# Patient Record
Sex: Female | Born: 1965 | Race: Black or African American | Hispanic: No | Smoking: Former smoker
Health system: Southern US, Community
[De-identification: ages and names within clinical notes are randomized; demographics above are authoritative.]

## PROBLEM LIST (undated history)

## (undated) DIAGNOSIS — K5909 Other constipation: Secondary | ICD-10-CM

## (undated) DIAGNOSIS — F209 Schizophrenia, unspecified: Secondary | ICD-10-CM

## (undated) DIAGNOSIS — K649 Unspecified hemorrhoids: Secondary | ICD-10-CM

## (undated) DIAGNOSIS — N912 Amenorrhea, unspecified: Secondary | ICD-10-CM

## (undated) DIAGNOSIS — R630 Anorexia: Secondary | ICD-10-CM

## (undated) DIAGNOSIS — R32 Unspecified urinary incontinence: Secondary | ICD-10-CM

## (undated) DIAGNOSIS — E039 Hypothyroidism, unspecified: Secondary | ICD-10-CM

## (undated) DIAGNOSIS — E7800 Pure hypercholesterolemia, unspecified: Secondary | ICD-10-CM

## (undated) DIAGNOSIS — E785 Hyperlipidemia, unspecified: Secondary | ICD-10-CM

## (undated) DIAGNOSIS — J309 Allergic rhinitis, unspecified: Secondary | ICD-10-CM

## (undated) DIAGNOSIS — B009 Herpesviral infection, unspecified: Secondary | ICD-10-CM

## (undated) DIAGNOSIS — R45851 Suicidal ideations: Secondary | ICD-10-CM

## (undated) DIAGNOSIS — K625 Hemorrhage of anus and rectum: Secondary | ICD-10-CM

## (undated) DIAGNOSIS — K219 Gastro-esophageal reflux disease without esophagitis: Secondary | ICD-10-CM

## (undated) DIAGNOSIS — Z8659 Personal history of other mental and behavioral disorders: Secondary | ICD-10-CM

## (undated) DIAGNOSIS — I10 Essential (primary) hypertension: Secondary | ICD-10-CM

## (undated) DIAGNOSIS — G43909 Migraine, unspecified, not intractable, without status migrainosus: Secondary | ICD-10-CM

## (undated) DIAGNOSIS — E538 Deficiency of other specified B group vitamins: Secondary | ICD-10-CM

## (undated) DIAGNOSIS — F32A Depression, unspecified: Secondary | ICD-10-CM

## (undated) DIAGNOSIS — F329 Major depressive disorder, single episode, unspecified: Secondary | ICD-10-CM

## (undated) DIAGNOSIS — F1910 Other psychoactive substance abuse, uncomplicated: Secondary | ICD-10-CM

## (undated) HISTORY — DX: Depression, unspecified: F32.A

## (undated) HISTORY — DX: Unspecified urinary incontinence: R32

## (undated) HISTORY — DX: Anorexia: R63.0

## (undated) HISTORY — PX: TOTAL ABDOMINAL HYSTERECTOMY: SHX209

## (undated) HISTORY — DX: Hyperlipidemia, unspecified: E78.5

## (undated) HISTORY — DX: Other psychoactive substance abuse, uncomplicated: F19.10

## (undated) HISTORY — DX: Suicidal ideations: R45.851

## (undated) HISTORY — DX: Migraine, unspecified, not intractable, without status migrainosus: G43.909

## (undated) HISTORY — PX: TUBAL LIGATION: SHX77

## (undated) HISTORY — DX: Other constipation: K59.09

## (undated) HISTORY — PX: WRIST SURGERY: SHX841

## (undated) HISTORY — DX: Schizophrenia, unspecified: F20.9

## (undated) HISTORY — DX: Allergic rhinitis, unspecified: J30.9

## (undated) HISTORY — DX: Hypothyroidism, unspecified: E03.9

## (undated) HISTORY — DX: Hemorrhage of anus and rectum: K62.5

## (undated) HISTORY — DX: Herpesviral infection, unspecified: B00.9

## (undated) HISTORY — DX: Deficiency of other specified B group vitamins: E53.8

## (undated) HISTORY — DX: Amenorrhea, unspecified: N91.2

## (undated) HISTORY — DX: Gastro-esophageal reflux disease without esophagitis: K21.9

## (undated) HISTORY — DX: Personal history of other mental and behavioral disorders: Z86.59

## (undated) HISTORY — DX: Unspecified hemorrhoids: K64.9

---
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
## 2003-07-08 ENCOUNTER — Encounter: Admission: RE | Admit: 2003-07-08 | Discharge: 2003-07-08 | Payer: Self-pay | Admitting: Obstetrics and Gynecology

## 2003-07-15 ENCOUNTER — Encounter: Admission: RE | Admit: 2003-07-15 | Discharge: 2003-07-15 | Payer: Self-pay | Admitting: Obstetrics & Gynecology

## 2010-09-11 ENCOUNTER — Encounter: Payer: Self-pay | Admitting: Obstetrics & Gynecology

## 2014-07-04 ENCOUNTER — Encounter (HOSPITAL_COMMUNITY): Payer: Self-pay | Admitting: *Deleted

## 2014-07-04 ENCOUNTER — Emergency Department (HOSPITAL_COMMUNITY)
Admission: EM | Admit: 2014-07-04 | Discharge: 2014-07-05 | Disposition: A | Discharge status: Other Acute Inpatient Hospital | Payer: Medicaid Other | Attending: Emergency Medicine | Admitting: Emergency Medicine

## 2014-07-04 DIAGNOSIS — Z008 Encounter for other general examination: Secondary | ICD-10-CM | POA: Insufficient documentation

## 2014-07-04 DIAGNOSIS — Z720 Tobacco use: Secondary | ICD-10-CM | POA: Diagnosis not present

## 2014-07-04 DIAGNOSIS — F1010 Alcohol abuse, uncomplicated: Secondary | ICD-10-CM | POA: Diagnosis not present

## 2014-07-04 DIAGNOSIS — F1410 Cocaine abuse, uncomplicated: Secondary | ICD-10-CM | POA: Diagnosis not present

## 2014-07-04 DIAGNOSIS — I10 Essential (primary) hypertension: Secondary | ICD-10-CM | POA: Diagnosis not present

## 2014-07-04 DIAGNOSIS — Z8639 Personal history of other endocrine, nutritional and metabolic disease: Secondary | ICD-10-CM | POA: Insufficient documentation

## 2014-07-04 HISTORY — DX: Essential (primary) hypertension: I10

## 2014-07-04 HISTORY — DX: Pure hypercholesterolemia, unspecified: E78.00

## 2014-07-04 LAB — CBC WITH DIFFERENTIAL/PLATELET
Basophils Absolute: 0 10*3/uL (ref 0.0–0.1)
Basophils Relative: 1 % (ref 0–1)
Eosinophils Absolute: 0.4 10*3/uL (ref 0.0–0.7)
Eosinophils Relative: 5 % (ref 0–5)
HCT: 36.8 % (ref 36.0–46.0)
Hemoglobin: 12.3 g/dL (ref 12.0–15.0)
Lymphocytes Relative: 29 % (ref 12–46)
Lymphs Abs: 2.6 10*3/uL (ref 0.7–4.0)
MCH: 29.4 pg (ref 26.0–34.0)
MCHC: 33.4 g/dL (ref 30.0–36.0)
MCV: 88 fL (ref 78.0–100.0)
Monocytes Absolute: 0.8 10*3/uL (ref 0.1–1.0)
Monocytes Relative: 9 % (ref 3–12)
Neutro Abs: 4.9 10*3/uL (ref 1.7–7.7)
Neutrophils Relative %: 56 % (ref 43–77)
Platelets: 333 10*3/uL (ref 150–400)
RBC: 4.18 MIL/uL (ref 3.87–5.11)
RDW: 13.9 % (ref 11.5–15.5)
WBC: 8.7 10*3/uL (ref 4.0–10.5)

## 2014-07-04 LAB — I-STAT TROPONIN, ED: Troponin i, poc: 0 ng/mL (ref 0.00–0.08)

## 2014-07-04 LAB — BASIC METABOLIC PANEL
Anion gap: 15 (ref 5–15)
BUN: 13 mg/dL (ref 6–23)
CO2: 24 mEq/L (ref 19–32)
Calcium: 9.6 mg/dL (ref 8.4–10.5)
Chloride: 102 mEq/L (ref 96–112)
Creatinine, Ser: 0.98 mg/dL (ref 0.50–1.10)
GFR calc Af Amer: 77 mL/min — ABNORMAL LOW (ref >90)
GFR calc non Af Amer: 66 mL/min — ABNORMAL LOW (ref >90)
Glucose, Bld: 87 mg/dL (ref 70–99)
Potassium: 4.5 mEq/L (ref 3.7–5.3)
Sodium: 141 mEq/L (ref 137–147)

## 2014-07-04 LAB — RAPID URINE DRUG SCREEN, HOSP PERFORMED
Amphetamines: NOT DETECTED
Barbiturates: NOT DETECTED
Benzodiazepines: NOT DETECTED
Cocaine: POSITIVE — AB
Opiates: NOT DETECTED
Tetrahydrocannabinol: NOT DETECTED

## 2014-07-04 LAB — HEPATIC FUNCTION PANEL
ALT: 14 U/L (ref 0–35)
AST: 30 U/L (ref 0–37)
Albumin: 3.8 g/dL (ref 3.5–5.2)
Alkaline Phosphatase: 37 U/L — ABNORMAL LOW (ref 39–117)
Bilirubin, Direct: <0.2 mg/dL (ref 0.0–0.3)
Total Bilirubin: <0.2 mg/dL — ABNORMAL LOW (ref 0.3–1.2)
Total Protein: 7.7 g/dL (ref 6.0–8.3)

## 2014-07-04 LAB — POC URINE PREG, ED: Preg Test, Ur: NEGATIVE

## 2014-07-04 LAB — ETHANOL: Alcohol, Ethyl (B): <11 mg/dL (ref 0–11)

## 2014-07-04 MED ORDER — ASPIRIN EC 81 MG PO TBEC
81.0000 mg | DELAYED_RELEASE_TABLET | Freq: Every day | ORAL | Status: DC
  Administered 2014-07-05: 81 mg via ORAL
  Filled 2014-07-04: qty 1

## 2014-07-04 MED ORDER — DIVALPROEX SODIUM 250 MG PO DR TAB
500.0000 mg | DELAYED_RELEASE_TABLET | Freq: Two times a day (BID) | ORAL | Status: DC
  Administered 2014-07-04 – 2014-07-05 (×2): 500 mg via ORAL
  Filled 2014-07-04 (×2): qty 2

## 2014-07-04 MED ORDER — ZOLPIDEM TARTRATE 5 MG PO TABS
10.0000 mg | ORAL_TABLET | Freq: Every day | ORAL | Status: DC
  Administered 2014-07-04: 10 mg via ORAL
  Filled 2014-07-04 (×2): qty 2

## 2014-07-04 MED ORDER — ROSUVASTATIN CALCIUM 5 MG PO TABS
5.0000 mg | ORAL_TABLET | Freq: Every day | ORAL | Status: DC
  Administered 2014-07-05: 5 mg via ORAL
  Filled 2014-07-04: qty 1

## 2014-07-04 MED ORDER — VALACYCLOVIR HCL 500 MG PO TABS
500.0000 mg | ORAL_TABLET | Freq: Every day | ORAL | Status: DC
  Administered 2014-07-05: 500 mg via ORAL
  Filled 2014-07-04: qty 1

## 2014-07-04 MED ORDER — NICOTINE 21 MG/24HR TD PT24
21.0000 mg | MEDICATED_PATCH | Freq: Once | TRANSDERMAL | Status: DC
  Administered 2014-07-04: 21 mg via TRANSDERMAL
  Filled 2014-07-04: qty 1

## 2014-07-04 MED ORDER — FENOFIBRATE 160 MG PO TABS
160.0000 mg | ORAL_TABLET | Freq: Every day | ORAL | Status: DC
  Administered 2014-07-05: 160 mg via ORAL
  Filled 2014-07-04: qty 1

## 2014-07-04 MED ORDER — LORATADINE 10 MG PO TABS
10.0000 mg | ORAL_TABLET | Freq: Every day | ORAL | Status: DC
  Administered 2014-07-05: 10 mg via ORAL
  Filled 2014-07-04: qty 1

## 2014-07-04 MED ORDER — FLUOXETINE HCL 20 MG PO CAPS
20.0000 mg | ORAL_CAPSULE | Freq: Every day | ORAL | Status: DC
  Administered 2014-07-05: 20 mg via ORAL
  Filled 2014-07-04: qty 1

## 2014-07-04 MED ORDER — DOCUSATE SODIUM 100 MG PO CAPS
100.0000 mg | ORAL_CAPSULE | Freq: Every day | ORAL | Status: DC
  Administered 2014-07-05: 100 mg via ORAL
  Filled 2014-07-04: qty 1

## 2014-07-04 NOTE — ED Notes (Signed)
TTS COMPLETED 

## 2014-07-04 NOTE — ED Provider Notes (Signed)
CSN: [id number]     Arrival date & time 07/04/14  1145 History   First MD Initiated Contact with Patient 07/04/14 1337     Chief Complaint  Patient presents with  . Alcohol Problem  . Medical Clearance     (Consider location/radiation/quality/duration/timing/severity/associated sxs/prior Treatment) HPI Comments: Patient with history of hypertension, high cholesterol, and MR, presents emergency department with chief complaint of requesting detox from alcohol and crack cocaine. She states that she has been drinking for many years. She states that she drinks approximately a half a case of alcohol per day. She also reports to using crack cocaine frequently. She denies any chest pain or shortness of breath. Denies any nausea, vomiting, diarrhea, or constipation. She does not have any suicidal or homicidal thoughts. There are no aggravating or alleviating factors. Patient is accompanied by family member.  Mother is POA.  Patient does not have capacity to make her own medical decisions.  The history is provided by the patient and a parent. No language interpreter was used.    Past Medical History  Diagnosis Date  . Hypertension   . High cholesterol    No past surgical history on file. No family history on file. History  Substance Use Topics  . Smoking status: Current Every Day Smoker    Types: Cigarettes  . Smokeless tobacco: Not on file  . Alcohol Use: Yes     Comment: heavy   OB History    No data available     Review of Systems  Constitutional: Negative for fever and chills.  Respiratory: Negative for shortness of breath.   Cardiovascular: Negative for chest pain.  Gastrointestinal: Negative for nausea, vomiting, diarrhea and constipation.  Genitourinary: Negative for dysuria.  All other systems reviewed and are negative.     Allergies  Review of patient's allergies indicates no known allergies.  Home Medications   Prior to Admission medications   Not on File   BP  146/92 mmHg  Pulse 79  Temp(Src) 98.2 F (36.8 C) (Oral)  Resp 20  Wt 111 lb (50.349 kg)  SpO2 96% Physical Exam  Constitutional: She is oriented to person, place, and time. She appears well-developed and well-nourished.  HENT:  Head: Normocephalic and atraumatic.  Eyes: Conjunctivae and EOM are normal. Pupils are equal, round, and reactive to light.  Neck: Normal range of motion. Neck supple.  Cardiovascular: Normal rate and regular rhythm.  Exam reveals no gallop and no friction rub.   No murmur heard. Pulmonary/Chest: Effort normal and breath sounds normal. No respiratory distress. She has no wheezes. She has no rales. She exhibits no tenderness.  Abdominal: Soft. Bowel sounds are normal. She exhibits no distension and no mass. There is no tenderness. There is no rebound and no guarding.  No focal abdominal tenderness, no RLQ tenderness or pain at McBurney's point, no RUQ tenderness or Murphy's sign, no left-sided abdominal tenderness, no fluid wave, or signs of peritonitis   Musculoskeletal: Normal range of motion. She exhibits no edema or tenderness.  Neurological: She is alert and oriented to person, place, and time.  Skin: Skin is warm and dry.  Psychiatric: She has a normal mood and affect. Her behavior is normal. Judgment and thought content normal.  Nursing note and vitals reviewed.   ED Course  Procedures (including critical care time) Results for orders placed or performed during the hospital encounter of 07/04/14  CBC with Differential  Result Value Ref Range   WBC 8.7 4.0 - 10.5 K/uL  RBC 4.18 3.87 - 5.11 MIL/uL   Hemoglobin 12.3 12.0 - 15.0 g/dL   HCT 16.136.8 09.636.0 - 04.546.0 %   MCV 88.0 78.0 - 100.0 fL   MCH 29.4 26.0 - 34.0 pg   MCHC 33.4 30.0 - 36.0 g/dL   RDW 40.913.9 81.111.5 - 91.415.5 %   Platelets 333 150 - 400 K/uL   Neutrophils Relative % 56 43 - 77 %   Neutro Abs 4.9 1.7 - 7.7 K/uL   Lymphocytes Relative 29 12 - 46 %   Lymphs Abs 2.6 0.7 - 4.0 K/uL   Monocytes  Relative 9 3 - 12 %   Monocytes Absolute 0.8 0.1 - 1.0 K/uL   Eosinophils Relative 5 0 - 5 %   Eosinophils Absolute 0.4 0.0 - 0.7 K/uL   Basophils Relative 1 0 - 1 %   Basophils Absolute 0.0 0.0 - 0.1 K/uL  Basic metabolic panel  Result Value Ref Range   Sodium 141 137 - 147 mEq/L   Potassium 4.5 3.7 - 5.3 mEq/L   Chloride 102 96 - 112 mEq/L   CO2 24 19 - 32 mEq/L   Glucose, Bld 87 70 - 99 mg/dL   BUN 13 6 - 23 mg/dL   Creatinine, Ser 7.820.98 0.50 - 1.10 mg/dL   Calcium 9.6 8.4 - 95.610.5 mg/dL   GFR calc non Af Amer 66 (L) >90 mL/min   GFR calc Af Amer 77 (L) >90 mL/min   Anion gap 15 5 - 15  Ethanol  Result Value Ref Range   Alcohol, Ethyl (B) <11 0 - 11 mg/dL  Urine rapid drug screen (hosp performed)  Result Value Ref Range   Opiates NONE DETECTED NONE DETECTED   Cocaine POSITIVE (A) NONE DETECTED   Benzodiazepines NONE DETECTED NONE DETECTED   Amphetamines NONE DETECTED NONE DETECTED   Tetrahydrocannabinol NONE DETECTED NONE DETECTED   Barbiturates NONE DETECTED NONE DETECTED  POC urine preg, ED ([facility])  Result Value Ref Range   Preg Test, Ur NEGATIVE NEGATIVE   No results found.    EKG Interpretation   Date/Time:  Friday July 04 2014 14:24:52 EST Ventricular Rate:  60 PR Interval:  147 QRS Duration: 70 QT Interval:  421 QTC Calculation: 421 R Axis:   -38 Text Interpretation:  Sinus rhythm Left axis deviation Low voltage,  precordial leads Borderline T wave abnormalities Baseline wander in  lead(s) V5 No previous ECGs available Confirmed by YAO  MD, DAVID ([id number])  on 07/04/2014 2:45:08 PM      MDM   Final diagnoses:  None    Patient here for detox from Etoh and cocaine.  Will check labs, consult TTS.  LFTs still pending.  Will have oncoming provider follow-up on LFTs.   Mother is POA and wants the patient to go through detox.  Patient does not make her own medical decisions.    Roxy Horsemanobert Kaytlen Lightsey, PA-C 07/04/14 1700  Richardean Canalavid H Yao, MD 07/07/14  [phone]

## 2014-07-04 NOTE — ED Notes (Signed)
Pt requesting detox from etoh. Last drank yesterday and also reports crack cocaine use. Denies any SI or HI. Calm and cooperative at triage.

## 2014-07-04 NOTE — ED Notes (Addendum)
Pt placed in scrubs, mother at bedside-- has POA --  Copy at bedside. Mother= Jacklynn GanongBrenda Freeman cell==[phone].  Pt is MR, has lived in a group home in past-- now living with mom "for a long time" per pt. Has been in rehab in past in [place]. Admits to drinking liquor and beer-- does not say how much, smoking crack also.

## 2014-07-04 NOTE — BH Assessment (Addendum)
Tele Assessment Note   Susan Giles is an 48 y.o. female that was assessed this day via tele assessment by this clinician after consulting with Ailene Ravelobin Hess, [facility] per order of Roxy Horsemanobert Browning, PA-C.  Pt present with her mother (lives with mother, Susan Giles - [phone]) requesting detox from alcohol and crack cocaine. Pt has a diagnosis of MR.  Pt states that she has been drinking for "years, " and "a long time."  She stated to ED staff that she drinks approximately a half a case of alcohol per day. However, she told this clinician she was not sure how much she drank, but that she drinks liquor daily.  Pt also reports to using crack cocaine 1-2 x/week, both last used last night.  Pt stated she has been in detox and treatment in the past, but could not recall where.  Pt has dx of MR, severity unknown and is a poor historian.  Per ED notes, pt [facility] for SA.  Pt stated the only withdrawal sx she is having currently is headache.  Pt denies hx of seizures.  Pt denies SI, HI or AVH.  No delusions noted.  Pt stated she takes medications, but Pt was calm, cooperative, oriented x 4, had euthymic mood, appropriate affect, logical/coherent thought processes, appeared disheveled and is in scrubs.  Pt wants detox.  Attempted to call mother and left a message for her to [facility].  Consult[facility] with Dr. Jannifer FranklinAkintayo[facility], who recommended inpatient detox for the pt @ 1830.  Informed Ailene Ravelobin Hess, PA-C, of pt disposition.  Updated ED and TTS staff.  TTS to seek placement for the pt.  Axis I: 303.90 Alcohol Use Disorder, Severe, Cocaine Use Disorder, Moderate Axis II: Deferred Axis III:  Past Medical History  Diagnosis Date  . Hypertension   . High cholesterol    Axis IV: other psychosocial or environmental problems Axis V: 41-50 serious symptoms  Past Medical History:  Past Medical History  Diagnosis Date  . Hypertension   . High cholesterol     No past  surgical history on file.  Family History: No family history on file.  Social History:  reports that she has been smoking Cigarettes.  She has been smoking about 0.00 packs per day. She does not have any smokeless tobacco history on file. She reports that she drinks alcohol. She reports that she uses illicit drugs (Cocaine).  Additional Social History:  Alcohol / Drug Use Pain Medications: see med list Prescriptions: see med list  Over the Counter: see med list History of alcohol / drug use?: Yes Longest period of sobriety (when/how long): unknown Negative Consequences of Use:  (pt denies) Withdrawal Symptoms: Other (Comment) Substance #1 Name of Substance 1: Alcohol - liquor 1 - Age of First Use: unknown 1 - Amount (size/oz): unknown 1 - Frequency: daily 1 - Duration: unknown 1 - Last Use / Amount: last night - unk amount of liquor Substance #2 Name of Substance 2: Crack cocaine 2 - Age of First Use: unknown 2 - Amount (size/oz): unknown 2 - Frequency: 1-2 x/week 2 - Duration: unknown 2 - Last Use / Amount: last night - unknown amount  CIWA: CIWA-Ar BP: 141/78 mmHg Pulse Rate: (!) 54 Nausea and Vomiting: no nausea and no vomiting Tactile Disturbances: none Tremor: no tremor Auditory Disturbances: not present Paroxysmal Sweats: no sweat visible Visual Disturbances: not present Anxiety: no anxiety, at ease Headache, Fullness in Head:  none present Agitation: normal activity Orientation and Clouding of Sensorium: oriented and can do serial additions CIWA-Ar Total: 0 COWS:    PATIENT STRENGTHS: (choose at least two) Ability for insight Motivation for treatment/growth Supportive family/friends  Allergies:  Allergies  Allergen Reactions  . Penicillins Other (See Comments)    Unknown     Home Medications:  (Not in a hospital admission)  OB/GYN Status:  No LMP recorded.  General Assessment Data [facility] Is this a Tele or Face-to-Face  Assessment?: Tele Assessment Is this an Initial Assessment or a Re-assessment for this encounter?: Initial Assessment Living Arrangements: Parent Can pt return to current living arrangement?: Yes Admission Status: Voluntary Is patient capable of signing voluntary admission?: Yes Transfer from: Acute Hospital Referral Source: Self/Family/Friend     [facility] Living Arrangements: Parent Name of Psychiatrist: Unknown Name of Therapist: Unknown  Education Status Is patient currently in school?: No  Risk to self with the past 6 months Suicidal Ideation: No Suicidal Intent: No Is patient at risk for suicide?: No Suicidal Plan?: No Access to Means: No What has been your use of drugs/alcohol within the last 12 months?: Pt reports alcohol and crack cocaine use Previous Attempts/Gestures: No How many times?: 0 Other Self Harm Risks: na - pt denies Triggers for Past Attempts: None known Intentional Self Injurious Behavior: None Family Suicide History: Unknown Recent stressful life event(s): Other (Comment) (SA) Persecutory voices/beliefs?: No Depression: No Depression Symptoms:  (pt denies depressive sx) Substance abuse history and/or treatment for substance abuse?: Yes Suicide prevention information given to non-admitted patients: Not applicable  Risk to Others within the past 6 months Homicidal Ideation: No Thoughts of Harm to Others: No Current Homicidal Intent: No Current Homicidal Plan: No Access to Homicidal Means: No Identified Victim: na - pt denies History of harm to others?: No Assessment of Violence: None Noted Violent Behavior Description: na - pt calm, cooperative Does patient have access to weapons?: No Criminal Charges Pending?: No Does patient have a court date: No  Psychosis Hallucinations: None noted Delusions: None noted  Mental Status Report Appear/Hygiene: Disheveled, In scrubs Eye Contact: Good Motor Activity: Freedom of movement,  Unsteady Speech: Logical/coherent Level of Consciousness: Alert Mood: Euthymic Affect: Appropriate to circumstance Anxiety Level: Minimal Thought Processes: Coherent, Relevant Judgement: Impaired Orientation: Person, Place, Time, Situation Obsessive Compulsive Thoughts/Behaviors: None  Cognitive Functioning Concentration: Normal Memory: Recent Intact, Remote Intact IQ: Below Average Level of Function: MR Insight: Fair Impulse Control: Fair Appetite: Good Weight Loss: 0 Weight Gain: 0 Sleep: No Change Total Hours of Sleep:  (reports she has been sleeping) Vegetative Symptoms: None  ADLScreening [facility]) Patient's cognitive ability adequate to safely complete daily activities?: Yes Patient able to express need for assistance with ADLs?: Yes Independently performs ADLs?: Yes (appropriate for developmental age)  Prior Inpatient Therapy Prior Inpatient Therapy: Yes Prior Therapy Dates: Unknown Prior Therapy Facilty/Provider(s): Unknown Reason for Treatment: SA  Prior Outpatient Therapy Prior Outpatient Therapy: Yes Prior Therapy Dates: Unknown Prior Therapy Facilty/Provider(s): Unknown Reason for Treatment: SA  ADL Screening (condition at time of admission) Patient's cognitive ability adequate to safely complete daily activities?: Yes Is the patient deaf or have difficulty hearing?: No Does the patient have difficulty seeing, even when wearing glasses/contacts?: No Does the patient have difficulty concentrating, remembering, or making decisions?: No Patient able to express need for assistance with ADLs?: Yes Does the patient have difficulty dressing or bathing?: No Independently performs ADLs?: Yes (appropriate for  developmental age) Does the patient have difficulty walking or climbing stairs?: No  Home Assistive Devices/Equipment Home Assistive Devices/Equipment: None    Abuse/Neglect Assessment (Assessment to be complete while patient is  alone) Physical Abuse: Denies Verbal Abuse: Denies Sexual Abuse: Denies Exploitation of patient/patient's resources: Denies Self-Neglect: Denies Values / Beliefs Cultural Requests During Hospitalization: None Spiritual Requests During Hospitalization: None Consults Spiritual Care Consult Needed: No Social Work Consult Needed: No Merchant navy officer (For Healthcare) Does patient have an advance directive?: Yes Type of Advance Directive: Midwife Copy of advanced directive(s) in chart?: Yes    Additional Information 1:1 In Past 12 Months?: No CIRT Risk: No Elopement Risk: No Does patient have medical clearance?: Yes     Disposition:  Disposition Initial Assessment Completed for this Encounter: Yes Disposition of Patient: Referred to, Inpatient treatment program Type of inpatient treatment program: Adult  [name], MS, [facility] Licensed Professional Counselor Therapeutic Triage Specialist [facility] Phone: [phone] Fax: [phone]  07/04/2014 6:16 PM

## 2014-07-05 ENCOUNTER — Observation Stay (HOSPITAL_COMMUNITY)
Admission: AD | Admit: 2014-07-05 | Discharge: 2014-07-07 | Disposition: A | Discharge status: Home / Self Care | Payer: Medicaid Other | Source: Intra-hospital | Attending: Nurse Practitioner | Admitting: Nurse Practitioner

## 2014-07-05 ENCOUNTER — Encounter (HOSPITAL_COMMUNITY): Payer: Self-pay | Admitting: *Deleted

## 2014-07-05 DIAGNOSIS — F10229 Alcohol dependence with intoxication, unspecified: Principal | ICD-10-CM | POA: Insufficient documentation

## 2014-07-05 DIAGNOSIS — I10 Essential (primary) hypertension: Secondary | ICD-10-CM | POA: Diagnosis not present

## 2014-07-05 DIAGNOSIS — Z720 Tobacco use: Secondary | ICD-10-CM | POA: Insufficient documentation

## 2014-07-05 DIAGNOSIS — F1020 Alcohol dependence, uncomplicated: Secondary | ICD-10-CM | POA: Insufficient documentation

## 2014-07-05 DIAGNOSIS — Z880 Allergy status to penicillin: Secondary | ICD-10-CM | POA: Insufficient documentation

## 2014-07-05 DIAGNOSIS — F319 Bipolar disorder, unspecified: Secondary | ICD-10-CM | POA: Diagnosis not present

## 2014-07-05 DIAGNOSIS — Z79899 Other long term (current) drug therapy: Secondary | ICD-10-CM | POA: Insufficient documentation

## 2014-07-05 DIAGNOSIS — F1410 Cocaine abuse, uncomplicated: Secondary | ICD-10-CM | POA: Diagnosis not present

## 2014-07-05 MED ORDER — TRAZODONE HCL 50 MG PO TABS
50.0000 mg | ORAL_TABLET | Freq: Every evening | ORAL | Status: DC | PRN
  Administered 2014-07-05: 50 mg via ORAL
  Filled 2014-07-05: qty 1

## 2014-07-05 MED ORDER — HYDROXYZINE HCL 25 MG PO TABS
25.0000 mg | ORAL_TABLET | Freq: Four times a day (QID) | ORAL | Status: DC | PRN
  Administered 2014-07-05: 25 mg via ORAL
  Filled 2014-07-05: qty 1

## 2014-07-05 MED ORDER — DIVALPROEX SODIUM 500 MG PO DR TAB
500.0000 mg | DELAYED_RELEASE_TABLET | Freq: Two times a day (BID) | ORAL | Status: DC
  Administered 2014-07-05 – 2014-07-07 (×4): 500 mg via ORAL
  Filled 2014-07-05 (×8): qty 1

## 2014-07-05 MED ORDER — LORATADINE 10 MG PO TABS
10.0000 mg | ORAL_TABLET | Freq: Every day | ORAL | Status: DC
  Administered 2014-07-06 – 2014-07-07 (×2): 10 mg via ORAL
  Filled 2014-07-05 (×4): qty 1

## 2014-07-05 MED ORDER — ALUM & MAG HYDROXIDE-SIMETH 200-200-20 MG/5ML PO SUSP: 30.0000 mL | ORAL | Status: DC | PRN

## 2014-07-05 MED ORDER — ZOLPIDEM TARTRATE 5 MG PO TABS: 5.0000 mg | ORAL_TABLET | Freq: Every evening | ORAL | Status: DC | PRN

## 2014-07-05 MED ORDER — FLUOXETINE HCL 20 MG PO CAPS
20.0000 mg | ORAL_CAPSULE | Freq: Every day | ORAL | Status: DC
  Administered 2014-07-06 – 2014-07-07 (×2): 20 mg via ORAL
  Filled 2014-07-05 (×4): qty 1

## 2014-07-05 MED ORDER — LORAZEPAM 1 MG PO TABS: 1.0000 mg | ORAL_TABLET | Freq: Four times a day (QID) | ORAL | Status: DC | PRN

## 2014-07-05 MED ORDER — ROSUVASTATIN CALCIUM 5 MG PO TABS
5.0000 mg | ORAL_TABLET | Freq: Every day | ORAL | Status: DC
  Administered 2014-07-06: 5 mg via ORAL
  Filled 2014-07-05 (×2): qty 1

## 2014-07-05 MED ORDER — ROSUVASTATIN CALCIUM 5 MG PO TABS
5.0000 mg | ORAL_TABLET | Freq: Every day | ORAL | Status: DC
  Filled 2014-07-05 (×3): qty 1

## 2014-07-05 MED ORDER — LORAZEPAM 1 MG PO TABS
1.0000 mg | ORAL_TABLET | Freq: Four times a day (QID) | ORAL | Status: DC | PRN
  Administered 2014-07-05: 1 mg via ORAL
  Filled 2014-07-05: qty 1

## 2014-07-05 MED ORDER — MAGNESIUM HYDROXIDE 400 MG/5ML PO SUSP: 30.0000 mL | Freq: Every day | ORAL | Status: DC | PRN

## 2014-07-05 MED ORDER — VALACYCLOVIR HCL 500 MG PO TABS
500.0000 mg | ORAL_TABLET | Freq: Every day | ORAL | Status: DC
  Administered 2014-07-06 – 2014-07-07 (×2): 500 mg via ORAL
  Filled 2014-07-05 (×4): qty 1

## 2014-07-05 MED ORDER — DOCUSATE SODIUM 100 MG PO CAPS
100.0000 mg | ORAL_CAPSULE | Freq: Every day | ORAL | Status: DC
  Administered 2014-07-06 – 2014-07-07 (×2): 100 mg via ORAL
  Filled 2014-07-05 (×4): qty 1

## 2014-07-05 MED ORDER — ACETAMINOPHEN 325 MG PO TABS
650.0000 mg | ORAL_TABLET | Freq: Four times a day (QID) | ORAL | Status: DC | PRN
  Administered 2014-07-06 – 2014-07-07 (×2): 650 mg via ORAL
  Filled 2014-07-05 (×2): qty 2

## 2014-07-05 MED ORDER — ASPIRIN 81 MG PO CHEW
81.0000 mg | CHEWABLE_TABLET | Freq: Every day | ORAL | Status: DC
  Administered 2014-07-06 – 2014-07-07 (×2): 81 mg via ORAL
  Filled 2014-07-05 (×5): qty 1

## 2014-07-05 NOTE — Consult Note (Signed)
Telepsych Consultation   Reason for Consult: Alcohol abuse, dependence Referring Physician:  EDP Susan Giles is an 48 y.o. female.  Assessment   Past Medical History  Diagnosis Date  . Hypertension   . High cholesterol      Plan:  Recommend psychiatric Inpatient admission when medically cleared.  Subjective:   Susan Giles is a 48 y.o. female patient admitted with Alcohol dependence.  HPI:  Evaluated female, AA age 67 years old via tele-Psychiatry at [facility] ER.  Patient has a hx of MR, unknown severity but participated actively during this interview.  Patient states she is at the ER brought in by her mother for excessive alcohol use.  Patient is a poor historian but stated that she has been treated for alcohol use in the past.  She could not quantify her alcohol and cocaine use.  Patient also stated that she use Crack Cocaine two times a week depending on how much money she has.  Patient reports a hx of Bipolar disorder and stated that she is compliant with her medications but drinks alcohol and use Crack cocaine.  Patient reports poor sleep but denies using alcohol for sleep.  She denies SI/HI/AVH.   Collateral from Mother, Susan Giles:  Susan Giles stated that her daughter cannot keep money and that she drinks or buys cocaine with her money.  She states that her daughter drinks until she passes out but denied alcohol withdrawal seizures.  She states that her daughter uses more alcohol than cocaine.  Patient, according to her mother has had issues with alcohol and that endangers her self with alcohol.  She reported that patient have never attempted suicide in the past.  Mother states that she administers her Bipolar medications and patient is compliant with her medications. We have accepted patient to our observation unit for safety and safe detox.  Her mother is already working to send her to outpatient rehabilitation facility after her discharge.  Patient is being seen  for her Bipolar disorder  at [facility].  HPI Elements:   Location:  Alcohol dependence, Bipolar disorder, Cocaine abuse. Quality:  anxious, irritable,. Severity:  moderate. Duration:  long hx of alcohol dependence. Context:  Wanting to detox from alcohol, crack Cocaine.  Past Psychiatric History: Past Medical History  Diagnosis Date  . Hypertension   . High cholesterol     reports that she has been smoking Cigarettes.  She has been smoking about 0.00 packs per day. She does not have any smokeless tobacco history on file. She reports that she drinks alcohol. She reports that she uses illicit drugs (Cocaine). No family history on file. Family History Substance Abuse:  (Unknown) Family Supports: Yes, List: (Mother) Living Arrangements: Parent Can pt return to current living arrangement?: Yes Allergies:   Allergies  Allergen Reactions  . Penicillins Other (See Comments)    Unknown     ACT Assessment Complete:  Yes:    Educational Status    Risk to Self: Risk to self with the past 6 months Suicidal Ideation: No Suicidal Intent: No Is patient at risk for suicide?: No Suicidal Plan?: No Access to Means: No What has been your use of drugs/alcohol within the last 12 months?: Pt reports alcohol and crack cocaine use Previous Attempts/Gestures: No How many times?: 0 Other Self Harm Risks: na - pt denies Triggers for Past Attempts: None known Intentional Self Injurious Behavior: None Family Suicide History: Unknown Recent stressful life event(s): Other (Comment) (SA) Persecutory voices/beliefs?:  No Depression: No Depression Symptoms:  (pt denies depressive sx) Substance abuse history and/or treatment for substance abuse?: Yes ([facility]) Suicide prevention information given to non-admitted patients: Not applicable  Risk to Others: Risk to Others within the past 6 months Homicidal Ideation: No Thoughts of Harm to  Others: No Current Homicidal Intent: No Current Homicidal Plan: No Access to Homicidal Means: No Identified Victim: na - pt denies History of harm to others?: No Assessment of Violence: None Noted Violent Behavior Description: na - pt calm, cooperative Does patient have access to weapons?: No Criminal Charges Pending?: No Does patient have a court date: No  Abuse: Abuse/Neglect Assessment (Assessment to be complete while patient is alone) Physical Abuse: Denies Verbal Abuse: Denies Sexual Abuse: Denies Exploitation of patient/patient's resources: Denies Self-Neglect: Denies  Prior Inpatient Therapy: Prior Inpatient Therapy Prior Inpatient Therapy: Yes Prior Therapy Dates: Unknown Prior Therapy Facilty/Provider(s): Unknown Reason for Treatment: SA  Prior Outpatient Therapy: Prior Outpatient Therapy Prior Outpatient Therapy: Yes Prior Therapy Dates: Unknown Prior Therapy Facilty/Provider(s): Unknown Reason for Treatment: SA  Additional Information: Additional Information 1:1 In Past 12 Months?: No CIRT Risk: No Elopement Risk: No Does patient have medical clearance?: Yes   Objective: Blood pressure 116/82, pulse 65, temperature 97.7 F (36.5 C), temperature source Oral, resp. rate 18, weight 50.349 kg (111 lb), SpO2 98 %.There is no height on file to calculate BMI. Results for orders placed or performed during the hospital encounter of 07/04/14 (from the past 72 hour(s))  Urine rapid drug screen (hosp performed)     Status: Abnormal   Collection Time: 07/04/14  2:27 PM  Result Value Ref Range   Opiates NONE DETECTED NONE DETECTED   Cocaine POSITIVE (A) NONE DETECTED   Benzodiazepines NONE DETECTED NONE DETECTED   Amphetamines NONE DETECTED NONE DETECTED   Tetrahydrocannabinol NONE DETECTED NONE DETECTED   Barbiturates NONE DETECTED NONE DETECTED    Comment:        DRUG SCREEN FOR MEDICAL PURPOSES ONLY.  IF CONFIRMATION IS NEEDED FOR ANY PURPOSE, NOTIFY LAB WITHIN 5  DAYS.        LOWEST DETECTABLE LIMITS FOR URINE DRUG SCREEN Drug Class       Cutoff (ng/mL) Amphetamine      1000 Barbiturate      200 Benzodiazepine   269 Tricyclics       485 Opiates          300 Cocaine          300 THC              50   CBC with Differential     Status: None   Collection Time: 07/04/14  2:45 PM  Result Value Ref Range   WBC 8.7 4.0 - 10.5 K/uL   RBC 4.18 3.87 - 5.11 MIL/uL   Hemoglobin 12.3 12.0 - 15.0 g/dL   HCT 36.8 36.0 - 46.0 %   MCV 88.0 78.0 - 100.0 fL   MCH 29.4 26.0 - 34.0 pg   MCHC 33.4 30.0 - 36.0 g/dL   RDW 13.9 11.5 - 15.5 %   Platelets 333 150 - 400 K/uL   Neutrophils Relative % 56 43 - 77 %   Neutro Abs 4.9 1.7 - 7.7 K/uL   Lymphocytes Relative 29 12 - 46 %   Lymphs Abs 2.6 0.7 - 4.0 K/uL   Monocytes Relative 9 3 - 12 %   Monocytes Absolute 0.8  0.1 - 1.0 K/uL   Eosinophils Relative 5 0 - 5 %   Eosinophils Absolute 0.4 0.0 - 0.7 K/uL   Basophils Relative 1 0 - 1 %   Basophils Absolute 0.0 0.0 - 0.1 K/uL  Basic metabolic panel     Status: Abnormal   Collection Time: 07/04/14  2:45 PM  Result Value Ref Range   Sodium 141 137 - 147 mEq/L   Potassium 4.5 3.7 - 5.3 mEq/L   Chloride 102 96 - 112 mEq/L   CO2 24 19 - 32 mEq/L   Glucose, Bld 87 70 - 99 mg/dL   BUN 13 6 - 23 mg/dL   Creatinine, Ser 0.98 0.50 - 1.10 mg/dL   Calcium 9.6 8.4 - 10.5 mg/dL   GFR calc non Af Amer 66 (L) >90 mL/min   GFR calc Af Amer 77 (L) >90 mL/min    Comment: (NOTE) The eGFR has been calculated using the CKD EPI equation. This calculation has not been validated in all clinical situations. eGFR's persistently <90 mL/min signify possible Chronic Kidney Disease.    Anion gap 15 5 - 15  Ethanol     Status: None   Collection Time: 07/04/14  2:45 PM  Result Value Ref Range   Alcohol, Ethyl (B) <11 0 - 11 mg/dL    Comment:        LOWEST DETECTABLE LIMIT FOR SERUM ALCOHOL IS 11 mg/dL FOR MEDICAL PURPOSES ONLY   Hepatic function panel     Status: Abnormal    Collection Time: 07/04/14  2:45 PM  Result Value Ref Range   Total Protein 7.7 6.0 - 8.3 g/dL   Albumin 3.8 3.5 - 5.2 g/dL   AST 30 0 - 37 U/L    Comment: HEMOLYSIS AT THIS LEVEL MAY AFFECT RESULT   ALT 14 0 - 35 U/L    Comment: HEMOLYSIS AT THIS LEVEL MAY AFFECT RESULT   Alkaline Phosphatase 37 (L) 39 - 117 U/L   Total Bilirubin <0.2 (L) 0.3 - 1.2 mg/dL   Bilirubin, Direct <0.2 0.0 - 0.3 mg/dL   Indirect Bilirubin NOT CALCULATED 0.3 - 0.9 mg/dL  POC urine preg, ED (not at [facility])     Status: None   Collection Time: 07/04/14  2:55 PM  Result Value Ref Range   Preg Test, Ur NEGATIVE NEGATIVE    Comment:        THE SENSITIVITY OF THIS METHODOLOGY IS >24 mIU/mL   I-stat troponin, ED     Status: None   Collection Time: 07/04/14  4:11 PM  Result Value Ref Range   Troponin i, poc 0.00 0.00 - 0.08 ng/mL   Comment 3            Comment: Due to the release kinetics of cTnI, a negative result within the first hours of the onset of symptoms does not rule out myocardial infarction with certainty. If myocardial infarction is still suspected, repeat the test at appropriate intervals.    Labs are reviewed and are pertinent for Unremarkable, UDS is positive for Cocaine.  Current Facility-Administered Medications  Medication Dose Route Frequency Provider Last Rate Last Dose  . aspirin EC tablet 81 mg  81 mg Oral Daily Ephraim Hamburger, MD      . divalproex (DEPAKOTE) DR tablet 500 mg  500 mg Oral BID Ephraim Hamburger, MD   500 mg at 07/04/14 2304  . docusate sodium (COLACE) capsule 100 mg  100 mg Oral Daily Ephraim Hamburger, MD      .  fenofibrate tablet 160 mg  160 mg Oral Daily Ephraim Hamburger, MD      . FLUoxetine (PROZAC) capsule 20 mg  20 mg Oral Daily Ephraim Hamburger, MD      . loratadine (CLARITIN) tablet 10 mg  10 mg Oral Daily Ephraim Hamburger, MD      . nicotine (NICODERM CQ - dosed in mg/24 hours) patch 21 mg  21 mg Transdermal Once Montine Circle, PA-C   21 mg at 07/04/14 1529  .  rosuvastatin (CRESTOR) tablet 5 mg  5 mg Oral Daily Ephraim Hamburger, MD      . valACYclovir (VALTREX) tablet 500 mg  500 mg Oral Daily Ephraim Hamburger, MD      . zolpidem (AMBIEN) tablet 10 mg  10 mg Oral QHS Ephraim Hamburger, MD   10 mg at 07/04/14 2304   Current Outpatient Prescriptions  Medication Sig Dispense Refill  . aspirin EC 81 MG tablet Take 81 mg by mouth daily.    . divalproex (DEPAKOTE) 250 MG DR tablet Take 500 mg by mouth 2 (two) times daily.    Marland Kitchen docusate sodium (COLACE) 100 MG capsule Take 100 mg by mouth daily.    . fenofibrate (TRICOR) 145 MG tablet Take 145 mg by mouth daily.    Marland Kitchen FLUoxetine (PROZAC) 20 MG capsule Take 20 mg by mouth daily.    Marland Kitchen loratadine (CLARITIN) 10 MG tablet Take 10 mg by mouth daily.    . rosuvastatin (CRESTOR) 5 MG tablet Take 5 mg by mouth daily.    . valACYclovir (VALTREX) 500 MG tablet Take 500 mg by mouth daily.    Marland Kitchen zolpidem (AMBIEN) 10 MG tablet Take 10 mg by mouth at bedtime.      Psychiatric Specialty Exam:     Blood pressure 116/82, pulse 65, temperature 97.7 F (36.5 C), temperature source Oral, resp. rate 18, weight 50.349 kg (111 lb), SpO2 98 %.There is no height on file to calculate BMI.  General Appearance: Casual and Fairly Groomed  Engineer, water::  Good  Speech:  Clear and Coherent  Volume:  Normal  Mood:  Anxious  Affect:  Congruent  Thought Process:  Coherent, Goal Directed and Intact  Orientation:  Full (Time, Place, and Person)  Thought Content:  WDL  Suicidal Thoughts:  No  Homicidal Thoughts:  No  Memory:  Immediate;   Good Recent;   Poor Remote;   Poor  Judgement:  Impaired  Insight:  Good  Psychomotor Activity:  Tremor  Concentration:  Good  Recall:  NA  Akathisia:  NA  Handed:  Right  AIMS (if indicated):     Assets:  Desire for Improvement  Sleep:      Treatment Plan Summary:  Consulted with Dr Doyne Keel who concur with the plan of care to admit in our Observation unit. Medication  Management  Disposition:  Admit to observation unit Disposition Initial Assessment Completed for this Encounter: Yes Disposition of Patient: Referred to, Inpatient treatment program Type of inpatient treatment program: Adult  Delfin Gant   PMHNP-BC 07/05/2014 10:15 AM

## 2014-07-05 NOTE — H&P (Signed)
[place] FACE TO FACE OBSERVATION H/P   Susan Giles is an 48 y.o. female.  Assessment   Past Medical History  Diagnosis Date  . Hypertension   . High cholesterol     Plan:  Admit to observation unit, use Lorazepam as needed for withdrawal symptoms, Resume home medications.  Subjective:   Susan Giles is a 48 y.o. female patient admitted with Alcohol dependence.  HPI:  Evaluated female, AA age 6 years old via tele-Psychiatry at [facility] ER.  Patient has a hx of MR, unknown severity but participated actively during this interview.  Patient states she is at the ER brought in by her mother for excessive alcohol use.  Patient is a poor historian but stated that she has been treated for alcohol use in the past.  She could not quantify her alcohol and cocaine use.  Patient also stated that she use Crack Cocaine two times a week depending on how much money she has.  Patient reports a hx of Bipolar disorder and stated that she is compliant with her medications but drinks alcohol and use Crack cocaine.  Patient reports poor sleep but denies using alcohol for sleep.  She denies SI/HI/AVH.   Collateral from Mother, MS Ruffin Pyo:  MS Hassan Rowan stated that her daughter cannot keep money and that she drinks or buys cocaine with her money.  She states that her daughter drinks until she passes out but denied alcohol withdrawal seizures.  She states that her daughter uses more alcohol than cocaine.  Patient, according to her mother has had issues with alcohol and that endangers her self with alcohol.  She reported that patient have never attempted suicide in the past.  Mother states that she administers her Bipolar medications and patient is compliant with her medications. We have accepted patient to our observation unit for safety and safe detox.  Her mother is already working to send her to outpatient rehabilitation facility after her discharge.  Patient is being seen for her Bipolar disorder   at [facility].   UPDATE Patient is admitted to our observation unit with all of her medications started. She plans to start AA/NA meetings on Monday.  Patient states she really want to stop drinking and using drugs.  She is calm and cooperative and we will offer her Lorazepam as needed for withdrawal symptoms.   She denies SI/HI/AVH. HPI Elements:   Location:  Alcohol dependence, Bipolar disorder, Cocaine abuse. Quality:  anxious, irritable,. Severity:  moderate. Duration:  long hx of alcohol dependence. Context:  Wanting to detox from alcohol, crack Cocaine.  Past Psychiatric History: Past Medical History  Diagnosis Date  . Hypertension   . High cholesterol     reports that she has been smoking Cigarettes.  She has been smoking about 0.00 packs per day. She does not have any smokeless tobacco history on file. She reports that she drinks alcohol. She reports that she uses illicit drugs (Cocaine). History reviewed. No pertinent family history.   Living Arrangements: Parent   Allergies:   Allergies  Allergen Reactions  . Penicillins Other (See Comments)    Unknown     ACT Assessment Complete:  Yes:    Educational Status    Risk to Self:    Risk to Others:    Abuse: Abuse/Neglect Assessment (Assessment to be complete while patient is alone) Physical Abuse: Denies Verbal Abuse: Denies Sexual Abuse: Denies Exploitation of patient/patient's resources: Denies Self-Neglect: Denies  Prior Inpatient Therapy:  Prior Outpatient Therapy:    Additional Information:     Objective: Blood pressure 120/83, pulse 87, temperature 98.1 F (36.7 C), temperature source Oral, resp. rate 18, height $RemoveBe'4\' 9"'[id number]$  (1.448 m), weight 48.988 kg (108 lb).Body mass index is 23.36 kg/(m^2). Results for orders placed or performed during the hospital encounter of 07/04/14 (from the past 72 hour(s))  Urine rapid drug screen (hosp performed)     Status: Abnormal   Collection Time: 07/04/14  2:27 PM  Result Value  Ref Range   Opiates NONE DETECTED NONE DETECTED   Cocaine POSITIVE (A) NONE DETECTED   Benzodiazepines NONE DETECTED NONE DETECTED   Amphetamines NONE DETECTED NONE DETECTED   Tetrahydrocannabinol NONE DETECTED NONE DETECTED   Barbiturates NONE DETECTED NONE DETECTED    Comment:        DRUG SCREEN FOR MEDICAL PURPOSES ONLY.  IF CONFIRMATION IS NEEDED FOR ANY PURPOSE, NOTIFY LAB WITHIN 5 DAYS.        LOWEST DETECTABLE LIMITS FOR URINE DRUG SCREEN Drug Class       Cutoff (ng/mL) Amphetamine      1000 Barbiturate      200 Benzodiazepine   416 Tricyclics       606 Opiates          300 Cocaine          300 THC              50   CBC with Differential     Status: None   Collection Time: 07/04/14  2:45 PM  Result Value Ref Range   WBC 8.7 4.0 - 10.5 K/uL   RBC 4.18 3.87 - 5.11 MIL/uL   Hemoglobin 12.3 12.0 - 15.0 g/dL   HCT 36.8 36.0 - 46.0 %   MCV 88.0 78.0 - 100.0 fL   MCH 29.4 26.0 - 34.0 pg   MCHC 33.4 30.0 - 36.0 g/dL   RDW 13.9 11.5 - 15.5 %   Platelets 333 150 - 400 K/uL   Neutrophils Relative % 56 43 - 77 %   Neutro Abs 4.9 1.7 - 7.7 K/uL   Lymphocytes Relative 29 12 - 46 %   Lymphs Abs 2.6 0.7 - 4.0 K/uL   Monocytes Relative 9 3 - 12 %   Monocytes Absolute 0.8 0.1 - 1.0 K/uL   Eosinophils Relative 5 0 - 5 %   Eosinophils Absolute 0.4 0.0 - 0.7 K/uL   Basophils Relative 1 0 - 1 %   Basophils Absolute 0.0 0.0 - 0.1 K/uL  Basic metabolic panel     Status: Abnormal   Collection Time: 07/04/14  2:45 PM  Result Value Ref Range   Sodium 141 137 - 147 mEq/L   Potassium 4.5 3.7 - 5.3 mEq/L   Chloride 102 96 - 112 mEq/L   CO2 24 19 - 32 mEq/L   Glucose, Bld 87 70 - 99 mg/dL   BUN 13 6 - 23 mg/dL   Creatinine, Ser 0.98 0.50 - 1.10 mg/dL   Calcium 9.6 8.4 - 10.5 mg/dL   GFR calc non Af Amer 66 (L) >90 mL/min   GFR calc Af Amer 77 (L) >90 mL/min    Comment: (NOTE) The eGFR has been calculated using the CKD EPI equation. This calculation has not been validated in all  clinical situations. eGFR's persistently <90 mL/min signify possible Chronic Kidney Disease.    Anion gap 15 5 - 15  Ethanol     Status: None   Collection Time:  07/04/14  2:45 PM  Result Value Ref Range   Alcohol, Ethyl (B) <11 0 - 11 mg/dL    Comment:        LOWEST DETECTABLE LIMIT FOR SERUM ALCOHOL IS 11 mg/dL FOR MEDICAL PURPOSES ONLY   Hepatic function panel     Status: Abnormal   Collection Time: 07/04/14  2:45 PM  Result Value Ref Range   Total Protein 7.7 6.0 - 8.3 g/dL   Albumin 3.8 3.5 - 5.2 g/dL   AST 30 0 - 37 U/L    Comment: HEMOLYSIS AT THIS LEVEL MAY AFFECT RESULT   ALT 14 0 - 35 U/L    Comment: HEMOLYSIS AT THIS LEVEL MAY AFFECT RESULT   Alkaline Phosphatase 37 (L) 39 - 117 U/L   Total Bilirubin <0.2 (L) 0.3 - 1.2 mg/dL   Bilirubin, Direct <0.2 0.0 - 0.3 mg/dL   Indirect Bilirubin NOT CALCULATED 0.3 - 0.9 mg/dL  POC urine preg, ED (not at [facility])     Status: None   Collection Time: 07/04/14  2:55 PM  Result Value Ref Range   Preg Test, Ur NEGATIVE NEGATIVE    Comment:        THE SENSITIVITY OF THIS METHODOLOGY IS >24 mIU/mL   I-stat troponin, ED     Status: None   Collection Time: 07/04/14  4:11 PM  Result Value Ref Range   Troponin i, poc 0.00 0.00 - 0.08 ng/mL   Comment 3            Comment: Due to the release kinetics of cTnI, a negative result within the first hours of the onset of symptoms does not rule out myocardial infarction with certainty. If myocardial infarction is still suspected, repeat the test at appropriate intervals.    Labs are reviewed and are pertinent for Unremarkable, UDS is positive for Cocaine.  Current Facility-Administered Medications  Medication Dose Route Frequency Provider Last Rate Last Dose  . acetaminophen (TYLENOL) tablet 650 mg  650 mg Oral Q6H PRN Delfin Gant, NP      . alum & mag hydroxide-simeth (MAALOX/MYLANTA) 200-200-20 MG/5ML suspension 30 mL  30 mL Oral Q4H PRN Delfin Gant, NP      . aspirin  chewable tablet 81 mg  81 mg Oral Daily Delfin Gant, NP   81 mg at 07/05/14 1518  . divalproex (DEPAKOTE) DR tablet 500 mg  500 mg Oral Q12H Delfin Gant, NP      . docusate sodium (COLACE) capsule 100 mg  100 mg Oral Daily Delfin Gant, NP   100 mg at 07/05/14 1519  . FLUoxetine (PROZAC) capsule 20 mg  20 mg Oral Daily Delfin Gant, NP   20 mg at 07/05/14 1519  . hydrOXYzine (ATARAX/VISTARIL) tablet 25 mg  25 mg Oral Q6H PRN Delfin Gant, NP      . loratadine (CLARITIN) tablet 10 mg  10 mg Oral Daily Delfin Gant, NP   10 mg at 07/05/14 1520  . LORazepam (ATIVAN) tablet 1 mg  1 mg Oral Q6H PRN Delfin Gant, NP      . magnesium hydroxide (MILK OF MAGNESIA) suspension 30 mL  30 mL Oral Daily PRN Delfin Gant, NP      . rosuvastatin (CRESTOR) tablet 5 mg  5 mg Oral q1800 Delfin Gant, NP      . traZODone (DESYREL) tablet 50 mg  50 mg Oral QHS PRN Delfin Gant, NP      .  valACYclovir (VALTREX) tablet 500 mg  500 mg Oral Daily Delfin Gant, NP   500 mg at 07/05/14 1520  . zolpidem (AMBIEN) tablet 5 mg  5 mg Oral QHS PRN Delfin Gant, NP        Psychiatric Specialty Exam:     Blood pressure 120/83, pulse 87, temperature 98.1 F (36.7 C), temperature source Oral, resp. rate 18, height $RemoveBe'4\' 9"'[id number]$  (9.024 m), weight 48.988 kg (108 lb).Body mass index is 23.36 kg/(m^2).  General Appearance: Casual and Fairly Groomed  Engineer, water::  Good  Speech:  Clear and Coherent  Volume:  Normal  Mood:  Anxious  Affect:  Congruent  Thought Process:  Coherent, Goal Directed and Intact  Orientation:  Full (Time, Place, and Person)  Thought Content:  WDL  Suicidal Thoughts:  No  Homicidal Thoughts:  No  Memory:  Immediate;   Good Recent;   Poor Remote;   Poor  Judgement:  Impaired  Insight:  Good  Psychomotor Activity:  Tremor  Concentration:  Good  Recall:  NA  Akathisia:  NA  Handed:  Right  AIMS (if indicated):     Assets:  Desire  for Improvement  Sleep:      Treatment Plan Summary: Medication Management  Disposition:  Admit to observation unit, Resume all home medications.  Treat withdrawal symptoms as needed.    Charmaine Downs, C   PMHNP-BC 07/05/2014 4:17 PM

## 2014-07-05 NOTE — BH Assessment (Signed)
[facility] Assessment Progress Note   Update:  Pt seen via tele psych by Dahlia ByesJosephine Onuoha, NP [facility]  Jacquelyne BalintShalita Forrest, RN, [facility] notified as was TTS staff.  Notifed Drinda ButtsAnnette, RN, pt's nurse [facility].  Casimer LaniusKristen Jiyan Walkowski, MS, [facility] Licensed Professional Counselor Therapeutic Triage Specialist [facility] Phone: [phone] Fax: [phone]

## 2014-07-05 NOTE — Tx Team (Signed)
Initial Interdisciplinary Treatment Plan   PATIENT STRESSORS: Substance abuse   PATIENT STRENGTHS: Motivation for treatment/growth   PROBLEM LIST: Problem List/Patient Goals Date to be addressed Date deferred Reason deferred Estimated date of resolution  Alcohol abuse 07/05/14                                                      DISCHARGE CRITERIA:  Withdrawal symptoms are absent or subacute and managed without 24-hour nursing intervention  PRELIMINARY DISCHARGE PLAN: Return to previous living arrangement  PATIENT/FAMIILY INVOLVEMENT: This treatment plan has been presented to and reviewed with the patient, Susan Giles, and/or family member.  The patient and family have been given the opportunity to ask questions and make suggestions.  Susan Giles, Susan Giles 07/05/2014, 3:05 PM

## 2014-07-05 NOTE — BH Assessment (Signed)
[name] Dr. Carlynn SpryWadhwania has declined Pt.  Susan Giles, LPC, [facility] Triage Specialist [phone]

## 2014-07-05 NOTE — Plan of Care (Signed)
[facility] Observation Crisis Plan  Reason for Crisis Plan:  Substance Abuse   Plan of Care:  Referral for Substance Abuse  Family Support:      Current Living Environment:  Living Arrangements: Parent  Insurance:   Hospital Account    Name Acct ID Class Status Primary Coverage   Susan Giles, Susan Giles [id number] BEHAVIORAL HEALTH OBSERVATION Open [place]        Guarantor Account (for Hospital Account [id number])    Name Relation to Pt Service Area Active? Acct Type   Susan Giles, Susan Giles Self [facility] Yes Behavioral Health   Address Phone       [address] [place], [place] [address] [phone](H)          Coverage Information (for Hospital Account [id number])    F/O Payor/Plan Precert #   [facility] [employer]    Subscriber Subscriber #   Susan Giles, Susan Giles [id number]   Address Phone   [address] [place], [place] [address] [phone]      Legal Guardian:     Primary Care Provider:  No primary care provider on file.  Current Outpatient Providers:  none at this time  Psychiatrist:     Counselor/Therapist:     Compliant with Medications:  Yes  Additional Information:   Buford DresserForrest, Martita Brumm Shanta 11/14/20153:09 PM

## 2014-07-05 NOTE — ED Notes (Signed)
PATIENT SAYES SHE DRINKS 2 SMALL CANS OF BEER ABOUT EVERY DAY. SOMETIMES SOME LIQUOR. STATES SHE HAS BEEN SMOKING CRACK FOR ABOUT A YEAR BUT DOES NOT SMOKE IT REGULARLY. PATIENT IS VERY POOR HISTORIAN DUE TO MR

## 2014-07-05 NOTE — Plan of Care (Signed)
Problem: Consults Goal: Substance Abuse Patient Education See Patient Education Module for education specifics. Outcome: Completed/Met Date Met:  07/05/14  Problem: Alteration in mood & ability to function due to Goal: LTG-Pt reports reduction in suicidal thoughts (Patient reports reduction in suicidal thoughts and is able to verbalize a safety plan for whenever patient is feeling suicidal) Outcome: Not Applicable Date Met:  07/05/14 Goal: LTG-Patient demonstrates decreased signs of withdrawal (Patient demonstrates decreased signs of withdrawal to the point the patient is safe to return home and continue treatment in an outpatient setting) Outcome: Completed/Met Date Met:  07/05/14 Goal: LTG-Pt verbalizes understanding of importance of med regimen (Patient verbalizes understanding of importance of medication regimen and need to continue outpatient care and support groups) Outcome: Completed/Met Date Met:  07/05/14 Goal: LTG-Pt is able to verbalize triggers for his/her abuse (Patient is able to verbalize triggers for his/her abuse and strategies to maintain sobriety) Outcome: Completed/Met Date Met:  07/05/14 Goal: STG: Patient verbalizes decreases in signs of withdrawal Outcome: Completed/Met Date Met:  07/05/14 Goal: STG-Patient will report withdrawal symptoms Outcome: Completed/Met Date Met:  07/05/14 Goal: STG-Pt will be introduced to the 12-step program of recovery (Patient will be introduced to the 12-step program of recovery and disease concept of addiction) Outcome: Not Applicable Date Met:  07/05/14 Goal: STG-Patient will attend groups Outcome: Not Applicable Date Met:  07/05/14 Goal: STG-Patient will comply with prescribed medication regimen (Patient will comply with prescribed medication regimen) Outcome: Completed/Met Date Met:  07/05/14     

## 2014-07-05 NOTE — ED Notes (Signed)
[facility] - RN will return call when opens.

## 2014-07-05 NOTE — Progress Notes (Signed)
Patient ID: Susan Giles, female   DOB: [birth year], 48 y.o.   MRN: [id number]  48 year old black female admitted to the observation unit, [facility] however her BAL was less than 11. Pt denied any drug use at time of admission, however her UDS was positive for cocaine. Pt reported that she was negative SI/HI, no AH/VH noted.

## 2014-07-05 NOTE — BH Assessment (Addendum)
[facility] Assessment Progress Note   Update:  Received call from pt's mother, Susan Giles - [phone], @ 0830, to give more information on the pt.  She is pt's POA.  She reported pt has been "running all night, drinking a lot."  She stated she uses crack cocaine as well, but she is not sure of the amount of either.  Pt has diagnoses of Bipolar II Disorder and Alcohol Use Disorder.  Pt also has Mild MR per mother.  Pt is prescribed Prozac 10 mg and Ambien PRN per mother and she attends [facility].  She sees Rosezetta SchlatterLacy Miller[place] who [place] pt [place]hab [place] [facility] [place].  Pt has a negative BAL and pt to be [facility] for further recommendations and evaluation.  Pt's mother requests to be called regarding pt's disposition.  Casimer LaniusKristen Renaud Celli, MS, [facility] Licensed Professional Counselor Therapeutic Triage Specialist [facility] Phone: [phone] Fax: [phone]

## 2014-07-05 NOTE — ED Notes (Signed)
[facility] consent and info release forms - [facility].

## 2014-07-05 NOTE — BH Assessment (Signed)
Susan Giles, AC at [facility], confirms adult unit is at capacity. Contacted the following facilities for placement:  BED AVAILABLE, FAXED CLINICAL INFORMATION: [facility], per Jessica [facility], per [facility], per [facility], per Latasha  AT CAPACITY: [facility], per Sue [facility], per [facility], per Nat[facility], per [facility], per [facility], per [facility], per Julie [facility], per Victoria [facility], per Sherry [facility], per Suzanne [facility], per Joe [facility], per Kathleen [facility], per Kevin [facility], per Cathleen [facility], per Shannon  NO RESPONSE: [facility]   Susan Giles Susan Giles Susan Giles, LPC, NCC Triage Specialist [phone]  

## 2014-07-06 DIAGNOSIS — F10230 Alcohol dependence with withdrawal, uncomplicated: Secondary | ICD-10-CM

## 2014-07-06 DIAGNOSIS — F10229 Alcohol dependence with intoxication, unspecified: Secondary | ICD-10-CM | POA: Diagnosis not present

## 2014-07-06 NOTE — Progress Notes (Signed)
[facility] MD Progress Note  07/06/2014 2:02 PM Susan Giles  MRN:  [id number] Subjective:  Patient still has mild tremors.  She denies SI/HI/AVH.  She constantly repeats same thoughts and idea asking if she will be home before thanksgiving.  Patient is constantly redirected and promised to go home in am.  She reported good sleep and fair appetite.  She is compliant with her medications.  Patient will be monitored overnight and will be discharged back to her mother.  She is in agreement with this plan of care.  Patient repeated her plan to join AA/NA meetings as soon as she is discharged. Diagnosis:   DSM5: Schizophrenia Disorders:   Obsessive-Compulsive Disorders:   Trauma-Stressor Disorders:   Substance/Addictive Disorders:  Alcohol Intoxication with Use Disorder - Severe (F10.229), Cocaine abuse, moderate Depressive Disorders:  Bipolar depressed, moderate Total Time spent with patient: 30 minutes    ADL's:  Impaired  Sleep: Good  Appetite:  Fair  Suicidal Ideation:  Denies Homicidal Ideation:  Denies  Psychiatric Specialty Exam: Physical Exam  ROS  Blood pressure 107/79, pulse 69, temperature 97.4 F (36.3 C), temperature source Oral, resp. rate 16, height $RemoveBe'4\' 9"'hsvvQkSgG$  (1.448 m), weight 48.988 kg (108 lb), SpO2 100 %.Body mass index is 23.36 kg/(m^2).  General Appearance: Casual  Eye Contact::  Good  Speech:  Clear and Coherent and Normal Rate  Volume:  Normal  Mood:  Euthymic  Affect:  Congruent  Thought Process:  Coherent and Goal Directed  Orientation:  Full (Time, Place, and Person)  Thought Content:  WDL  Suicidal Thoughts:  No  Homicidal Thoughts:  No  Memory:  Immediate;   Fair Recent;   Fair Remote;   Fair  Judgement:  Fair  Insight:  Good  Psychomotor Activity:  Tremor  Concentration:  Fair  Recall:  NA  Fund of Knowledge:Fair  Language: Good  Akathisia:  NA  Handed:  Right  AIMS (if indicated):     Assets:  Desire for Improvement  Sleep:       Musculoskeletal: Strength & Muscle Tone: within normal limits Gait & Station: normal Patient leans: N/A  Current Medications: Current Facility-Administered Medications  Medication Dose Route Frequency Provider Last Rate Last Dose  . acetaminophen (TYLENOL) tablet 650 mg  650 mg Oral Q6H PRN Delfin Gant, NP      . alum & mag hydroxide-simeth (MAALOX/MYLANTA) 200-200-20 MG/5ML suspension 30 mL  30 mL Oral Q4H PRN Delfin Gant, NP      . aspirin chewable tablet 81 mg  81 mg Oral Daily Delfin Gant, NP   81 mg at 07/06/14 1117  . divalproex (DEPAKOTE) DR tablet 500 mg  500 mg Oral Q12H Delfin Gant, NP   500 mg at 07/06/14 1117  . docusate sodium (COLACE) capsule 100 mg  100 mg Oral Daily Delfin Gant, NP   100 mg at 07/06/14 1117  . FLUoxetine (PROZAC) capsule 20 mg  20 mg Oral Daily Delfin Gant, NP   20 mg at 07/06/14 1118  . hydrOXYzine (ATARAX/VISTARIL) tablet 25 mg  25 mg Oral Q6H PRN Delfin Gant, NP   25 mg at 07/05/14 1641  . loratadine (CLARITIN) tablet 10 mg  10 mg Oral Daily Delfin Gant, NP   10 mg at 07/06/14 1117  . LORazepam (ATIVAN) tablet 1 mg  1 mg Oral Q6H PRN Delfin Gant, NP   1 mg at 07/05/14 1641  . magnesium hydroxide (MILK OF MAGNESIA) suspension  30 mL  30 mL Oral Daily PRN Delfin Gant, NP      . rosuvastatin (CRESTOR) tablet 5 mg  5 mg Oral q1800 Delfin Gant, NP      . traZODone (DESYREL) tablet 50 mg  50 mg Oral QHS PRN Delfin Gant, NP   50 mg at 07/05/14 2003  . valACYclovir (VALTREX) tablet 500 mg  500 mg Oral Daily Delfin Gant, NP   500 mg at 07/06/14 1118  . zolpidem (AMBIEN) tablet 5 mg  5 mg Oral QHS PRN Delfin Gant, NP        Lab Results:  Results for orders placed or performed during the hospital encounter of 07/04/14 (from the past 48 hour(s))  Urine rapid drug screen (hosp performed)     Status: Abnormal   Collection Time: 07/04/14  2:27 PM  Result Value Ref  Range   Opiates NONE DETECTED NONE DETECTED   Cocaine POSITIVE (A) NONE DETECTED   Benzodiazepines NONE DETECTED NONE DETECTED   Amphetamines NONE DETECTED NONE DETECTED   Tetrahydrocannabinol NONE DETECTED NONE DETECTED   Barbiturates NONE DETECTED NONE DETECTED    Comment:        DRUG SCREEN FOR MEDICAL PURPOSES ONLY.  IF CONFIRMATION IS NEEDED FOR ANY PURPOSE, NOTIFY LAB WITHIN 5 DAYS.        LOWEST DETECTABLE LIMITS FOR URINE DRUG SCREEN Drug Class       Cutoff (ng/mL) Amphetamine      1000 Barbiturate      200 Benzodiazepine   338 Tricyclics       250 Opiates          300 Cocaine          300 THC              50   CBC with Differential     Status: None   Collection Time: 07/04/14  2:45 PM  Result Value Ref Range   WBC 8.7 4.0 - 10.5 K/uL   RBC 4.18 3.87 - 5.11 MIL/uL   Hemoglobin 12.3 12.0 - 15.0 g/dL   HCT 36.8 36.0 - 46.0 %   MCV 88.0 78.0 - 100.0 fL   MCH 29.4 26.0 - 34.0 pg   MCHC 33.4 30.0 - 36.0 g/dL   RDW 13.9 11.5 - 15.5 %   Platelets 333 150 - 400 K/uL   Neutrophils Relative % 56 43 - 77 %   Neutro Abs 4.9 1.7 - 7.7 K/uL   Lymphocytes Relative 29 12 - 46 %   Lymphs Abs 2.6 0.7 - 4.0 K/uL   Monocytes Relative 9 3 - 12 %   Monocytes Absolute 0.8 0.1 - 1.0 K/uL   Eosinophils Relative 5 0 - 5 %   Eosinophils Absolute 0.4 0.0 - 0.7 K/uL   Basophils Relative 1 0 - 1 %   Basophils Absolute 0.0 0.0 - 0.1 K/uL  Basic metabolic panel     Status: Abnormal   Collection Time: 07/04/14  2:45 PM  Result Value Ref Range   Sodium 141 137 - 147 mEq/L   Potassium 4.5 3.7 - 5.3 mEq/L   Chloride 102 96 - 112 mEq/L   CO2 24 19 - 32 mEq/L   Glucose, Bld 87 70 - 99 mg/dL   BUN 13 6 - 23 mg/dL   Creatinine, Ser 0.98 0.50 - 1.10 mg/dL   Calcium 9.6 8.4 - 10.5 mg/dL   GFR calc non Af Amer 66 (L) >90 mL/min  GFR calc Af Amer 77 (L) >90 mL/min    Comment: (NOTE) The eGFR has been calculated using the CKD EPI equation. This calculation has not been validated in all  clinical situations. eGFR's persistently <90 mL/min signify possible Chronic Kidney Disease.    Anion gap 15 5 - 15  Ethanol     Status: None   Collection Time: 07/04/14  2:45 PM  Result Value Ref Range   Alcohol, Ethyl (B) <11 0 - 11 mg/dL    Comment:        LOWEST DETECTABLE LIMIT FOR SERUM ALCOHOL IS 11 mg/dL FOR MEDICAL PURPOSES ONLY   Hepatic function panel     Status: Abnormal   Collection Time: 07/04/14  2:45 PM  Result Value Ref Range   Total Protein 7.7 6.0 - 8.3 g/dL   Albumin 3.8 3.5 - 5.2 g/dL   AST 30 0 - 37 U/L    Comment: HEMOLYSIS AT THIS LEVEL MAY AFFECT RESULT   ALT 14 0 - 35 U/L    Comment: HEMOLYSIS AT THIS LEVEL MAY AFFECT RESULT   Alkaline Phosphatase 37 (L) 39 - 117 U/L   Total Bilirubin <0.2 (L) 0.3 - 1.2 mg/dL   Bilirubin, Direct <0.2 0.0 - 0.3 mg/dL   Indirect Bilirubin NOT CALCULATED 0.3 - 0.9 mg/dL  POC urine preg, ED (not at [facility])     Status: None   Collection Time: 07/04/14  2:55 PM  Result Value Ref Range   Preg Test, Ur NEGATIVE NEGATIVE    Comment:        THE SENSITIVITY OF THIS METHODOLOGY IS >24 mIU/mL   I-stat troponin, ED     Status: None   Collection Time: 07/04/14  4:11 PM  Result Value Ref Range   Troponin i, poc 0.00 0.00 - 0.08 ng/mL   Comment 3            Comment: Due to the release kinetics of cTnI, a negative result within the first hours of the onset of symptoms does not rule out myocardial infarction with certainty. If myocardial infarction is still suspected, repeat the test at appropriate intervals.     Physical Findings: AIMS: Facial and Oral Movements Muscles of Facial Expression: None, normal Lips and Perioral Area: None, normal Jaw: None, normal Tongue: None, normal,Extremity Movements Upper (arms, wrists, hands, fingers): None, normal Lower (legs, knees, ankles, toes): None, normal, Trunk Movements Neck, shoulders, hips: None, normal, Overall Severity Severity of abnormal movements (highest score from  questions above): None, normal Incapacitation due to abnormal movements: None, normal Patient's awareness of abnormal movements (rate only patient's report): No Awareness, Dental Status Current problems with teeth and/or dentures?: Yes (missing teeth) Does patient usually wear dentures?: No  CIWA:  CIWA-Ar Total: 0 COWS:     Treatment Plan Summary: Daily contact with patient to assess and evaluate symptoms and progress in treatment Medication management   Medical Decision Making Problem Points:  Established problem, stable/improving (1) Data Points:  Review and summation of old records (2)  I certify that Observation unit services furnished can reasonably be expected to improve the patient's condition.   Charmaine Downs, C    PMHNP-BC  07/06/2014, 2:02 PM

## 2014-07-06 NOTE — Progress Notes (Signed)
Pt alert, oriented and cooperative. "I'm ready to go home, I will be going to AA meetings".  Pt denies SI/HI, -A/[place]. Pt denies pain or discomfort. Emotional support and encouragement given. Will continue to monitor and evaluate for stabilization.

## 2014-07-06 NOTE — Progress Notes (Signed)
Patient ID: Susan Giles, female   DOB: 04-Apr-1966, 48 y.o.   MRN: [id number]   Patient has been appropriate in the observation unit today, pt reported that she was not ready to be discharged today and that she would be ready for discharge tomorrow. [facility] and that what she needs to do. Pt reported that she was negative SI/HI, no AH/VH noted.

## 2014-07-06 NOTE — Progress Notes (Signed)
Patient ID: Susan Giles, female   DOB: 1966-06-25, 48 y.o.   MRN: [id number]  Patient quiet, but pleasant on assessment. No inappropriate behaviors noted. Pt denies SI or plans to harm herself.

## 2014-07-07 DIAGNOSIS — F1410 Cocaine abuse, uncomplicated: Secondary | ICD-10-CM

## 2014-07-07 DIAGNOSIS — F1994 Other psychoactive substance use, unspecified with psychoactive substance-induced mood disorder: Secondary | ICD-10-CM

## 2014-07-07 DIAGNOSIS — F10129 Alcohol abuse with intoxication, unspecified: Secondary | ICD-10-CM

## 2014-07-07 DIAGNOSIS — F10229 Alcohol dependence with intoxication, unspecified: Secondary | ICD-10-CM | POA: Diagnosis not present

## 2014-07-07 MED ORDER — TRAZODONE HCL 50 MG PO TABS: 50.0000 mg | ORAL_TABLET | Freq: Every evening | ORAL | Status: AC | PRN

## 2014-07-07 MED ORDER — ROSUVASTATIN CALCIUM 5 MG PO TABS: 5.0000 mg | ORAL_TABLET | Freq: Every day | ORAL | Status: AC

## 2014-07-07 MED ORDER — DOCUSATE SODIUM 100 MG PO CAPS: 100.0000 mg | ORAL_CAPSULE | Freq: Every day | ORAL | Status: AC

## 2014-07-07 MED ORDER — FLUOXETINE HCL 20 MG PO CAPS: 20.0000 mg | ORAL_CAPSULE | Freq: Every day | ORAL | Status: AC

## 2014-07-07 MED ORDER — DIVALPROEX SODIUM 250 MG PO DR TAB: 500.0000 mg | DELAYED_RELEASE_TABLET | Freq: Two times a day (BID) | ORAL | Status: AC

## 2014-07-07 MED ORDER — HYDROXYZINE HCL 25 MG PO TABS: 25.0000 mg | ORAL_TABLET | Freq: Four times a day (QID) | ORAL | Status: AC | PRN

## 2014-07-07 MED ORDER — FENOFIBRATE 145 MG PO TABS: 145.0000 mg | ORAL_TABLET | Freq: Every day | ORAL | Status: AC

## 2014-07-07 MED ORDER — ASPIRIN EC 81 MG PO TBEC: 81.0000 mg | DELAYED_RELEASE_TABLET | Freq: Every day | ORAL | Status: AC

## 2014-07-07 MED ORDER — LORATADINE 10 MG PO TABS: 10.0000 mg | ORAL_TABLET | Freq: Every day | ORAL | Status: AC

## 2014-07-07 MED ORDER — VALACYCLOVIR HCL 500 MG PO TABS: 500.0000 mg | ORAL_TABLET | Freq: Every day | ORAL | Status: AC

## 2014-07-07 NOTE — Progress Notes (Signed)
Pt discharged home. DC instructions provided and explained. Medications reviewed. Rx given. All questions answered. Pt stable at discharge.  

## 2014-07-07 NOTE — Discharge Summary (Signed)
[facility]  07/07/2014 11:35 AM Susan BerlinCassandra A Giles  MRN:  [id number]  Subjective:  Pt seen and chart reviewed. She denies SI/HI/AVH. [facility]. CIWA/COWS scores are stable for discharge and both consistently below 11. Pt reports that she would like to followup with outpatient treatment and will [facility].   Diagnosis:   DSM5:  Schizophrenia Disorders:   Obsessive-Compulsive Disorders:   Trauma-Stressor Disorders:   Substance/Addictive Disorders:  Alcohol Intoxication with Use Disorder - Severe (F10.229), Cocaine abuse, moderate, Substance-induced mood disorder Depressive Disorders:  Bipolar depressed, moderate Total Time spent with patient: 25 minutes   ADL's:  WNL, Intact  Sleep: Good  Appetite:  Good  Suicidal Ideation:  Denies Homicidal Ideation:  Denies  Psychiatric Specialty Exam: Physical Exam  Review of Systems  Constitutional: Negative.   HENT: Negative.   Eyes: Negative.   Respiratory: Negative.   Cardiovascular: Negative.   Gastrointestinal: Negative.   Genitourinary: Negative.   Musculoskeletal: Negative.   Skin: Negative.   Neurological: Negative for tremors.  Endo/Heme/Allergies: Negative.   Psychiatric/Behavioral: Positive for depression and substance abuse. Negative for suicidal ideas and hallucinations. The patient is nervous/anxious and has insomnia.     Blood pressure 100/69, pulse 59, temperature 97.5 F (36.4 C), temperature source Oral, resp. rate 16, height 4\' 9"  (1.448 m), weight 48.988 kg (108 lb), SpO2 100 %.Body mass index is 23.36 kg/(m^2).  General Appearance: Casual  Eye Contact::  Good  Speech:  Clear and Coherent and Normal Rate  Volume:  Normal  Mood:  Euthymic  Affect:  Congruent  Thought Process:  Coherent and Goal Directed  Orientation:  Full (Time, Place, and Person)  Thought Content:  WDL  Suicidal Thoughts:  No  Homicidal Thoughts:  No   Memory:  Immediate;   Fair Recent;   Fair Remote;   Fair  Judgement:  Fair  Insight:  Good  Psychomotor Activity:  Normal  Concentration:  Fair  Recall:  NA  Fund of Knowledge:Fair  Language: Good  Akathisia:  NA  Handed:  Right  AIMS (if indicated):     Assets:  Desire for Improvement  Sleep:      Musculoskeletal: Strength & Muscle Tone: within normal limits Gait & Station: normal Patient leans: N/A  Current Medications: Current Facility-Administered Medications  Medication Dose Route Frequency Provider Last Rate Last Dose  . acetaminophen (TYLENOL) tablet 650 mg  650 mg Oral Q6H PRN Earney NavyJosephine C Onuoha, NP   650 mg at 07/06/14 2242  . alum & mag hydroxide-simeth (MAALOX/MYLANTA) 200-200-20 MG/5ML suspension 30 mL  30 mL Oral Q4H PRN Earney NavyJosephine C Onuoha, NP      . aspirin chewable tablet 81 mg  81 mg Oral Daily Earney NavyJosephine C Onuoha, NP   81 mg at 07/07/14 0835  . divalproex (DEPAKOTE) DR tablet 500 mg  500 mg Oral Q12H Earney NavyJosephine C Onuoha, NP   500 mg at 07/07/14 0834  . docusate sodium (COLACE) capsule 100 mg  100 mg Oral Daily Earney NavyJosephine C Onuoha, NP   100 mg at 07/07/14 0835  . FLUoxetine (PROZAC) capsule 20 mg  20 mg Oral Daily Earney NavyJosephine C Onuoha, NP   20 mg at 07/07/14 0834  . hydrOXYzine (ATARAX/VISTARIL) tablet 25 mg  25 mg Oral Q6H PRN Earney NavyJosephine C Onuoha, NP   25 mg at 07/05/14 1641  . loratadine (CLARITIN) tablet 10 mg  10 mg Oral Daily Josephine C  Onuoha, NP   10 mg at 07/07/14 0835  . LORazepam (ATIVAN) tablet 1 mg  1 mg Oral Q6H PRN Earney NavyJosephine C Onuoha, NP   1 mg at 07/05/14 1641  . magnesium hydroxide (MILK OF MAGNESIA) suspension 30 mL  30 mL Oral Daily PRN Earney NavyJosephine C Onuoha, NP      . rosuvastatin (CRESTOR) tablet 5 mg  5 mg Oral q1800 Earney NavyJosephine C Onuoha, NP   5 mg at 07/06/14 1858  . traZODone (DESYREL) tablet 50 mg  50 mg Oral QHS PRN Earney NavyJosephine C Onuoha, NP   50 mg at 07/05/14 2003  . valACYclovir (VALTREX) tablet 500 mg  500 mg Oral Daily Earney NavyJosephine C Onuoha, NP    500 mg at 07/07/14 0835  . zolpidem (AMBIEN) tablet 5 mg  5 mg Oral QHS PRN Earney NavyJosephine C Onuoha, NP        Lab Results:  No results found for this or any previous visit (from the past 48 hour(s)).  Physical Findings: AIMS: Facial and Oral Movements Muscles of Facial Expression: None, normal Lips and Perioral Area: None, normal Jaw: None, normal Tongue: None, normal,Extremity Movements Upper (arms, wrists, hands, fingers): None, normal Lower (legs, knees, ankles, toes): None, normal, Trunk Movements Neck, shoulders, hips: None, normal, Overall Severity Severity of abnormal movements (highest score from questions above): None, normal Incapacitation due to abnormal movements: None, normal Patient's awareness of abnormal movements (rate only patient's report): No Awareness, Dental Status Current problems with teeth and/or dentures?: Yes Does patient usually wear dentures?: No  CIWA:  CIWA-Ar Total: 0 COWS:     Treatment Plan Summary: -Discharge home with outpatient followup for substance abuse and depression.   Medical Decision Making Problem Points:  Established problem, stable/improving (1) Data Points:  Review and summation of old records (2)   Beau FannyWithrow, Shaheer Bonfield C, FNP-BC 07/07/2014, 11:35 AM

## 2014-07-07 NOTE — Plan of Care (Addendum)
[facility] Observation Crisis Plan  Reason for Crisis Plan:  Substance Abuse   Plan of Care:  Referral for Substance Abuse  Family Support:    Dan EuropeBrenda Freeland (Mother/legal guardian) [phone]; [phone]  Current Living Environment:  Living Arrangements: Parent; Mother/legal guardian; pt can return to household  Insurance:  [facility]    Name Acct ID Class Status Primary Coverage   Susan Giles, Gabi A [id number] BEHAVIORAL HEALTH OBSERVATION [place]        Guarantor Account (for Hospital Account [id number])    Name Relation to Pt Service Area Active? Acct Type   Susan BerlinAndrews, Jyl A Self [facility] Yes Behavioral Health   Address Phone       [address] [place], [place] [address] [phone])          Coverage Information (for Hospital Account [id number])    F/O Payor/Plan Precert #   [facility]    Subscriber Subscriber #   Susan Giles, Rhaya A [id number]   Address Phone   [address] [place], [place] [address] [phone]      Legal Guardian:   Mother Dan Europe(Brenda Freeland)  Primary Care Provider:  No primary care provider on file.; Pt reports that Dr Judith BlonderHawk is her PCP.  Current Outpatient Providers:  [facility] (in [place])  Psychiatrist:   [facility] [place]  Counselor/Therapist:   None currently, but pt recently received outpatient substance abuse treatment [facility]  Compliant with Medications:  Yes  Additional Information: In speaking to this pt it was discovered that her mother, Dan EuropeBrenda Freeland, is her legal guardian.  After Ms Alonna MiniumFreeland arrived this writer confirmed that the pt is diagnosed with IDD, although her level of functioning and her IQ are unknown.  Treatment options were discussed with the mother, who then signed Consent to Release Information to the following providers: [facility]), [facility], [facility],  and [facility].  [facility] reports that pt is eligible to return to them during walk-in hours to resume her substance abuse treatment.  However, they report that they have attempted to [facility] for [facility], but have encountered obstacles.  At 14:35 I then called [facility] and spoke to [place]a, who took some preliminary information.  She reports that in order to determine eligibility for care coordination, the mother would need to fax pt's IQ testing information to Garth BignessJackie Colvin, Environmental health practitionerAdministrative Assistant, at [phone].  If approved for care coordination, [facility] would then connect the pt with any treatment services that she needs.  This was reported to the mother, who informs me that she does not have pt's IQ testing on hand, but that either [facility] (pt's current outpatient provider), or pt's last group home may.  Pt's discharge instructions will advise pt to [facility], with [facility] for substance abuse, and with [facility] to continue application for Care Coordination.  Claudette Headonrad Withrow, NP is agreeable with these arrangements.  Doylene Canninghomas Maryruth Apple, MA Triage Specialist Doylene Canninghomas Sinia Antosh, MA Triage Specialist Raphael GibneyHughes, Arianny Pun Patrick 11/16/20154:01 PM

## 2014-07-07 NOTE — Discharge Instructions (Signed)
For your daughter's ongoing mental health needs, continue her treatment at [facility] [place], [place]:       [facility]      [address]      [place], [place] [address]      [phone]  Additionally, your daughter may be eligible for enhanced treatment services, including care coordination, through [facility].  They manage her Medicaid behavioral health benefits.  In order to see if she qualifies for care coordination, [facility] will need to receive any psychiatric testing that has been completed, including testing of her intellectual functioning.  They can receive this information directly from you, or from her service providers, including [facility] or the group home where she recently resided.  The main contact number for the [facility], along with the appropriate contact people and their phone numbers, are listed below:       [facility]      [address], [place], [place] [address]       [phone]      Marquis BuggyNarissa Huntley (Intake Referral Coordinator: Phone: [phone]      Garth BignessJackie Colvin (Administrative Assistant):          Phone: [phone]                                                                                       Fax:     [phone]  To help your daughter maintain a sober lifestyle, she may benefit from a substance abuse rehabilitation program.  She is eligible to [facility] treatment [facility] [place].  She is welcome to go there during routine walk-in hours for intake as indicated below:       [facility]      [address]      [place], [place] [address]      [phone]      Walk-in hours: Monday & Wednesday: 1:00 pm - 3:00 pm                                                            Friday: 8:00 am - 10:00 am

## 2015-10-22 ENCOUNTER — Encounter: Payer: Self-pay | Admitting: Sports Medicine

## 2015-10-22 ENCOUNTER — Ambulatory Visit (INDEPENDENT_AMBULATORY_CARE_PROVIDER_SITE_OTHER): Payer: Medicaid Other | Admitting: Sports Medicine

## 2015-10-22 DIAGNOSIS — M79676 Pain in unspecified toe(s): Secondary | ICD-10-CM

## 2015-10-22 DIAGNOSIS — B351 Tinea unguium: Secondary | ICD-10-CM | POA: Diagnosis not present

## 2015-10-22 DIAGNOSIS — B353 Tinea pedis: Secondary | ICD-10-CM

## 2015-10-22 DIAGNOSIS — M79671 Pain in right foot: Secondary | ICD-10-CM

## 2015-10-22 DIAGNOSIS — M79672 Pain in left foot: Secondary | ICD-10-CM

## 2015-10-22 MED ORDER — CLOTRIMAZOLE 1 % EX SOLN: 1.0000 "application " | Freq: Two times a day (BID) | CUTANEOUS | Status: DC

## 2015-10-22 NOTE — Progress Notes (Signed)
Patient ID: Susan Giles, female   DOB: 1965/12/04, 50 y.o.   MRN: [id number] Subjective: Susan Giles is a 50 y.o. female patient seen today in office with complaint of painful thickened and elongated toenails; unable to trim. Patient denies history of Diabetes, Neuropathy, or Vascular disease. Admits to itchy skin in between toes. Patient has no other pedal complaints at this time.   Patient is from [facility] group home and is assisted by caregiver.  Patient Active Problem List   Diagnosis Date Noted  . Alcohol dependence (HCC) 07/05/2014  . Uncomplicated alcohol dependence (HCC)     Current Outpatient Prescriptions on File Prior to Visit  Medication Sig Dispense Refill  . aspirin EC 81 MG tablet Take 1 tablet (81 mg total) by mouth daily.    . divalproex (DEPAKOTE) 250 MG DR tablet Take 2 tablets (500 mg total) by mouth 2 (two) times daily. 120 tablet 0  . docusate sodium (COLACE) 100 MG capsule Take 1 capsule (100 mg total) by mouth daily. 10 capsule 0  . fenofibrate (TRICOR) 145 MG tablet Take 1 tablet (145 mg total) by mouth daily.    Marland Kitchen FLUoxetine (PROZAC) 20 MG capsule Take 1 capsule (20 mg total) by mouth daily. 30 capsule 0  . hydrOXYzine (ATARAX/VISTARIL) 25 MG tablet Take 1 tablet (25 mg total) by mouth every 6 (six) hours as needed (anxiety). 21 tablet 0  . loratadine (CLARITIN) 10 MG tablet Take 1 tablet (10 mg total) by mouth daily.    . rosuvastatin (CRESTOR) 5 MG tablet Take 1 tablet (5 mg total) by mouth daily.    . traZODone (DESYREL) 50 MG tablet Take 1 tablet (50 mg total) by mouth at bedtime as needed for sleep. 14 tablet 0  . valACYclovir (VALTREX) 500 MG tablet Take 1 tablet (500 mg total) by mouth daily.     No current facility-administered medications on file prior to visit.    Allergies  Allergen Reactions  . Penicillins Other (See Comments)    Unknown     Objective: Physical Exam  General: Well developed, nourished, no acute distress, awake,  alert and oriented x 3  Vascular: Dorsalis pedis artery 2/4 bilateral, Posterior tibial artery 2/4 bilateral, skin temperature warm to warm proximal to distal bilateral lower extremities, no varicosities, pedal hair present bilateral.  Neurological: Gross sensation present via light touch bilateral.   Dermatological: Skin is warm, dry, and supple bilateral, Nails 1-10 are tender, long, thick, and discolored with mild subungal debris, mild webspace maceration with superimposed dry scaly skin present bilateral resembling tinea, no open lesions present bilateral, no callus/corns/hyperkeratotic tissue present bilateral. No signs of infection bilateral.  Musculoskeletal: Hammertoe deformities noted bilateral. Muscular strength within normal limits without pain on range of motion. No pain with calf compression bilateral.  Assessment and Plan:  Problem List Items Addressed This Visit    None    Visit Diagnoses    Dermatophytosis of nail    -  Primary    Relevant Medications    clotrimazole (LOTRIMIN) 1 % external solution    Tinea pedis of both feet        Relevant Medications    clotrimazole (LOTRIMIN) 1 % external solution    Foot pain, bilateral           -Examined patient.  -Discussed treatment options for painful mycotic nails and tinea pedis -Mechanically debrided and reduced mycotic nails with sterile nail nipper and dremel nail file without incident. -Rx Clotrimazole solution for  in between toes daily for tinea  -Recommend good hygiene habits -Patient to return in 3 months/as needed for follow up evaluation or sooner if symptoms worsen.  [name], DPM

## 2016-01-27 ENCOUNTER — Encounter: Payer: Self-pay | Admitting: Sports Medicine

## 2016-01-27 ENCOUNTER — Ambulatory Visit (INDEPENDENT_AMBULATORY_CARE_PROVIDER_SITE_OTHER): Payer: Medicaid Other | Admitting: Sports Medicine

## 2016-01-27 DIAGNOSIS — M79676 Pain in unspecified toe(s): Secondary | ICD-10-CM

## 2016-01-27 DIAGNOSIS — M79672 Pain in left foot: Secondary | ICD-10-CM

## 2016-01-27 DIAGNOSIS — B351 Tinea unguium: Secondary | ICD-10-CM | POA: Diagnosis not present

## 2016-01-27 DIAGNOSIS — B353 Tinea pedis: Secondary | ICD-10-CM

## 2016-01-27 DIAGNOSIS — M79671 Pain in right foot: Secondary | ICD-10-CM

## 2016-01-27 NOTE — Progress Notes (Signed)
Patient ID: Susan Berlinassandra A Giles, female   DOB: 1965-12-18, 50 y.o.   MRN: [id number]  Subjective: Susan BerlinCassandra A Heckart is a 50 y.o. female patient seen today in office with complaint of painful thickened and elongated toenails; unable to trim.  Admits to itchy skin in between toes that is better after clotrimazole. Patient has no other pedal complaints at this time.   [facility] and is assisted by caregiver.  Patient Active Problem List   Diagnosis Date Noted  . Alcohol dependence (HCC) 07/05/2014  . Uncomplicated alcohol dependence (HCC)     Current Outpatient Prescriptions on File Prior to Visit  Medication Sig Dispense Refill  . aspirin EC 81 MG tablet Take 1 tablet (81 mg total) by mouth daily.    . clotrimazole (LOTRIMIN) 1 % external solution Apply 1 application topically 2 (two) times daily. In between toes 30 mL 0  . divalproex (DEPAKOTE) 250 MG DR tablet Take 2 tablets (500 mg total) by mouth 2 (two) times daily. 120 tablet 0  . docusate sodium (COLACE) 100 MG capsule Take 1 capsule (100 mg total) by mouth daily. 10 capsule 0  . fenofibrate (TRICOR) 145 MG tablet Take 1 tablet (145 mg total) by mouth daily.    Marland Kitchen. FLUoxetine (PROZAC) 20 MG capsule Take 1 capsule (20 mg total) by mouth daily. 30 capsule 0  . hydrOXYzine (ATARAX/VISTARIL) 25 MG tablet Take 1 tablet (25 mg total) by mouth every 6 (six) hours as needed (anxiety). 21 tablet 0  . loratadine (CLARITIN) 10 MG tablet Take 1 tablet (10 mg total) by mouth daily.    . rosuvastatin (CRESTOR) 5 MG tablet Take 1 tablet (5 mg total) by mouth daily.    . traZODone (DESYREL) 50 MG tablet Take 1 tablet (50 mg total) by mouth at bedtime as needed for sleep. 14 tablet 0  . valACYclovir (VALTREX) 500 MG tablet Take 1 tablet (500 mg total) by mouth daily.     No current facility-administered medications on file prior to visit.    Allergies  Allergen Reactions  . Penicillins Other (See Comments)    Unknown      Objective: Physical Exam  General: Well developed, nourished, no acute distress, awake, alert and oriented x 3  Vascular: Dorsalis pedis artery 2/4 bilateral, Posterior tibial artery 2/4 bilateral, skin temperature warm to warm proximal to distal bilateral lower extremities, no varicosities, pedal hair present bilateral.  Neurological: Gross sensation present via light touch bilateral.   Dermatological: Skin is warm, dry, and supple bilateral, Nails 1-10 are tender, long, thick, and discolored with mild subungal debris, improving webspace maceration with superimposed dry scaly skin present bilateral resembling tinea, no open lesions present bilateral, no callus/corns/hyperkeratotic tissue present bilateral. No signs of infection bilateral.  Musculoskeletal: Hammertoe deformities noted bilateral. Muscular strength within normal limits without pain on range of motion. No pain with calf compression bilateral.  Assessment and Plan:  Problem List Items Addressed This Visit    None    Visit Diagnoses    Dermatophytosis of nail    -  Primary    Tinea pedis of both feet        Foot pain, bilateral          -Examined patient.  -Discussed treatment options for painful mycotic nails and tinea pedis -Mechanically debrided and reduced mycotic nails with sterile nail nipper and dremel nail file without incident. -Cont with Clotrimazole solution for in between toes daily for tinea  -Recommend  good hygiene habits -Patient to return in 3 months/as needed for follow up evaluation or sooner if symptoms worsen.  [name], DPM

## 2016-04-27 ENCOUNTER — Encounter: Payer: Self-pay | Admitting: Sports Medicine

## 2016-04-27 ENCOUNTER — Ambulatory Visit (INDEPENDENT_AMBULATORY_CARE_PROVIDER_SITE_OTHER): Payer: Medicaid Other | Admitting: Sports Medicine

## 2016-04-27 DIAGNOSIS — B351 Tinea unguium: Secondary | ICD-10-CM

## 2016-04-27 DIAGNOSIS — M79676 Pain in unspecified toe(s): Secondary | ICD-10-CM | POA: Diagnosis not present

## 2016-04-27 DIAGNOSIS — M79672 Pain in left foot: Secondary | ICD-10-CM

## 2016-04-27 DIAGNOSIS — B353 Tinea pedis: Secondary | ICD-10-CM

## 2016-04-27 DIAGNOSIS — M79671 Pain in right foot: Secondary | ICD-10-CM

## 2016-04-27 NOTE — Progress Notes (Signed)
Patient ID: Susan Giles, female   DOB: 03/11/1966, 50 y.o.   MRN: [id number]  Subjective: Susan Giles is a 50 y.o. female patient seen today in office with complaint of painful thickened and elongated toenails; unable to trim.  Admits to itchy skin in between toes that is better after clotrimazole and broke her right ankle in July and has been in a cast since, currently seeing another doctor for this. Patient has no other pedal complaints at this time.   [facility] and is assisted by caregiver.  Patient Active Problem List   Diagnosis Date Noted  . Alcohol dependence (HCC) 07/05/2014  . Uncomplicated alcohol dependence (HCC)     Current Outpatient Prescriptions on File Prior to Visit  Medication Sig Dispense Refill  . aspirin EC 81 MG tablet Take 1 tablet (81 mg total) by mouth daily.    . clotrimazole (LOTRIMIN) 1 % external solution Apply 1 application topically 2 (two) times daily. In between toes 30 mL 0  . divalproex (DEPAKOTE) 250 MG DR tablet Take 2 tablets (500 mg total) by mouth 2 (two) times daily. 120 tablet 0  . docusate sodium (COLACE) 100 MG capsule Take 1 capsule (100 mg total) by mouth daily. 10 capsule 0  . fenofibrate (TRICOR) 145 MG tablet Take 1 tablet (145 mg total) by mouth daily.    Marland Kitchen. FLUoxetine (PROZAC) 20 MG capsule Take 1 capsule (20 mg total) by mouth daily. 30 capsule 0  . hydrOXYzine (ATARAX/VISTARIL) 25 MG tablet Take 1 tablet (25 mg total) by mouth every 6 (six) hours as needed (anxiety). 21 tablet 0  . loratadine (CLARITIN) 10 MG tablet Take 1 tablet (10 mg total) by mouth daily.    . rosuvastatin (CRESTOR) 5 MG tablet Take 1 tablet (5 mg total) by mouth daily.    . traZODone (DESYREL) 50 MG tablet Take 1 tablet (50 mg total) by mouth at bedtime as needed for sleep. 14 tablet 0  . valACYclovir (VALTREX) 500 MG tablet Take 1 tablet (500 mg total) by mouth daily.     No current facility-administered medications on file prior  to visit.     Allergies  Allergen Reactions  . Penicillins Other (See Comments)    Unknown     Objective: Physical Exam  General: Well developed, nourished, no acute distress, awake, alert and oriented x 3  Cast clean dry and intact at right foot and ankle  Vascular: Dorsalis pedis artery 2/4 on left, Posterior tibial artery 2/4 on left, can not palpate right due to cast, skin temperature warm to warm proximal to distal bilateral lower extremities, CRT intact to all digits, pedal hair present bilateral.  Neurological: Gross sensation present via light touch bilateral.   Dermatological: Skin is warm, dry, and supple bilateral, Nails 1-10 are tender, long, thick, and discolored with mild subungal debris,resolved interdigital tinea, no open lesions present bilateral, no callus/corns/hyperkeratotic tissue present bilateral. No signs of infection bilateral.  Musculoskeletal: Hammertoe deformities noted bilateral. Muscular strength within normal limits without pain on range of motion. No pain with calf compression bilateral.  Assessment and Plan:  Problem List Items Addressed This Visit    None    Visit Diagnoses    Dermatophytosis of nail    -  Primary   Tinea pedis of both feet       Resolved   Foot pain, bilateral         -Examined patient.  -Discussed treatment options for painful  mycotic nails and tinea pedis -Mechanically debrided and reduced mycotic nails with sterile nail nipper and dremel nail file without incident. -Tinea resolved advised to continue to dry well in between toes and to keep right foot cast clean and dry  -Recommend good hygiene habits -Patient to return in 3 months/as needed for follow up evaluation or sooner if symptoms worsen.  [name], DPM

## 2016-07-27 ENCOUNTER — Ambulatory Visit: Payer: Medicaid Other | Admitting: Sports Medicine

## 2016-10-05 ENCOUNTER — Ambulatory Visit: Payer: Medicaid Other | Admitting: Sports Medicine

## 2016-10-07 ENCOUNTER — Encounter: Payer: Self-pay | Admitting: Sports Medicine

## 2016-10-07 ENCOUNTER — Ambulatory Visit: Payer: Medicaid Other | Admitting: Sports Medicine

## 2016-10-07 DIAGNOSIS — M79676 Pain in unspecified toe(s): Secondary | ICD-10-CM

## 2016-10-07 DIAGNOSIS — M79672 Pain in left foot: Secondary | ICD-10-CM

## 2016-10-07 DIAGNOSIS — M79671 Pain in right foot: Secondary | ICD-10-CM

## 2016-10-07 DIAGNOSIS — B351 Tinea unguium: Secondary | ICD-10-CM | POA: Diagnosis not present

## 2016-10-07 DIAGNOSIS — B353 Tinea pedis: Secondary | ICD-10-CM

## 2016-10-07 NOTE — Progress Notes (Signed)
Patient ID: Susan Giles, female   DOB: [birth year], 51 y.o.   MRN: [id number]  Subjective: Susan BerlinCassandra A Deharo is a 51 y.o. female patient seen today in office with complaint of painful thickened and elongated toenails; unable to trim.  Admits to itchy skin in between toes that is better after clotrimazole and however is back again. Patient has no other pedal complaints at this time.   [facility]  Patient Active Problem List   Diagnosis Date Noted  . Alcohol dependence (HCC) 07/05/2014  . Uncomplicated alcohol dependence (HCC)     Current Outpatient Prescriptions on File Prior to Visit  Medication Sig Dispense Refill  . aspirin EC 81 MG tablet Take 1 tablet (81 mg total) by mouth daily.    . clotrimazole (LOTRIMIN) 1 % external solution Apply 1 application topically 2 (two) times daily. In between toes 30 mL 0  . divalproex (DEPAKOTE) 250 MG DR tablet Take 2 tablets (500 mg total) by mouth 2 (two) times daily. 120 tablet 0  . docusate sodium (COLACE) 100 MG capsule Take 1 capsule (100 mg total) by mouth daily. 10 capsule 0  . fenofibrate (TRICOR) 145 MG tablet Take 1 tablet (145 mg total) by mouth daily.    Marland Kitchen. FLUoxetine (PROZAC) 20 MG capsule Take 1 capsule (20 mg total) by mouth daily. 30 capsule 0  . hydrOXYzine (ATARAX/VISTARIL) 25 MG tablet Take 1 tablet (25 mg total) by mouth every 6 (six) hours as needed (anxiety). 21 tablet 0  . loratadine (CLARITIN) 10 MG tablet Take 1 tablet (10 mg total) by mouth daily.    . rosuvastatin (CRESTOR) 5 MG tablet Take 1 tablet (5 mg total) by mouth daily.    . traZODone (DESYREL) 50 MG tablet Take 1 tablet (50 mg total) by mouth at bedtime as needed for sleep. 14 tablet 0  . valACYclovir (VALTREX) 500 MG tablet Take 1 tablet (500 mg total) by mouth daily.     No current facility-administered medications on file prior to visit.     Allergies  Allergen Reactions  . Penicillins Other (See Comments)    Unknown      Objective: Physical Exam  General: Well developed, nourished, no acute distress, awake, alert and oriented x 3  Cast clean dry and intact at right foot and ankle  Vascular: Dorsalis pedis artery 2/4 on left, Posterior tibial artery 2/4 on left, can not palpate right due to cast, skin temperature warm to warm proximal to distal bilateral lower extremities, CRT intact to all digits, pedal hair present bilateral.  Neurological: Gross sensation present via light touch bilateral.   Dermatological: Skin is warm, dry, and supple bilateral, Nails 1-10 are tender, long, thick, and discolored with mild subungal debris,recurred interdigital tinea, no open lesions present bilateral, no callus/corns/hyperkeratotic tissue present bilateral. No signs of infection bilateral.  Musculoskeletal: Hammertoe deformities noted bilateral. Muscular strength within normal limits without pain on range of motion. No pain with calf compression bilateral.  Assessment and Plan:  Problem List Items Addressed This Visit    None    Visit Diagnoses    Dermatophytosis of nail    -  Primary   Tinea pedis of both feet       Foot pain, bilateral         -Examined patient.  -Discussed treatment options for painful mycotic nails and tinea pedis -Mechanically debrided and reduced mycotic nails with sterile nail nipper and dremel nail file without incident. -Tinea rrecurred  advised to continue to dry well in between toes and to keep right foot cast clean and dry  -Recommend good hygiene habits -Patient to return in 3 months/as needed for follow up evaluation or sooner if symptoms worsen.  [name], DPM

## 2017-02-06 HISTORY — PX: COLONOSCOPY: SHX174

## 2017-05-03 ENCOUNTER — Ambulatory Visit (INDEPENDENT_AMBULATORY_CARE_PROVIDER_SITE_OTHER): Payer: Medicaid Other | Admitting: Sports Medicine

## 2017-05-03 DIAGNOSIS — B351 Tinea unguium: Secondary | ICD-10-CM

## 2017-05-03 DIAGNOSIS — B353 Tinea pedis: Secondary | ICD-10-CM

## 2017-05-03 DIAGNOSIS — M79676 Pain in unspecified toe(s): Secondary | ICD-10-CM

## 2017-05-03 DIAGNOSIS — M79671 Pain in right foot: Secondary | ICD-10-CM

## 2017-05-03 DIAGNOSIS — M79672 Pain in left foot: Secondary | ICD-10-CM

## 2017-05-03 NOTE — Progress Notes (Signed)
Patient ID: Susan Berlinassandra A Doolin, female   DOB: November 04, 1965, 51 y.o.   MRN: [id number]  Subjective: Susan Giles is a 51 y.o. female patient seen today in office with complaint of painful thickened and elongated toenails; unable to trim.  Admits to itchy skin in between toes that is some better again after clotrimazole. Patient had right foot cast removed several months ago and states no pain except to nails. Patient has no other pedal complaints at this time.   [facility] is [facility] and is assisted by caregiver who is in the waiting area today.  Patient Active Problem List   Diagnosis Date Noted  . Alcohol dependence (HCC) 07/05/2014  . Uncomplicated alcohol dependence (HCC)     Current Outpatient Prescriptions on File Prior to Visit  Medication Sig Dispense Refill  . aspirin EC 81 MG tablet Take 1 tablet (81 mg total) by mouth daily.    . clotrimazole (LOTRIMIN) 1 % external solution Apply 1 application topically 2 (two) times daily. In between toes 30 mL 0  . divalproex (DEPAKOTE) 250 MG DR tablet Take 2 tablets (500 mg total) by mouth 2 (two) times daily. 120 tablet 0  . docusate sodium (COLACE) 100 MG capsule Take 1 capsule (100 mg total) by mouth daily. 10 capsule 0  . fenofibrate (TRICOR) 145 MG tablet Take 1 tablet (145 mg total) by mouth daily.    Marland Kitchen. FLUoxetine (PROZAC) 20 MG capsule Take 1 capsule (20 mg total) by mouth daily. 30 capsule 0  . hydrOXYzine (ATARAX/VISTARIL) 25 MG tablet Take 1 tablet (25 mg total) by mouth every 6 (six) hours as needed (anxiety). 21 tablet 0  . loratadine (CLARITIN) 10 MG tablet Take 1 tablet (10 mg total) by mouth daily.    . rosuvastatin (CRESTOR) 5 MG tablet Take 1 tablet (5 mg total) by mouth daily.    . traZODone (DESYREL) 50 MG tablet Take 1 tablet (50 mg total) by mouth at bedtime as needed for sleep. 14 tablet 0  . valACYclovir (VALTREX) 500 MG tablet Take 1 tablet (500 mg total) by mouth daily.     No current  facility-administered medications on file prior to visit.     Allergies  Allergen Reactions  . Penicillins Other (See Comments)    Unknown     Objective: Physical Exam  General: Well developed, nourished, no acute distress, awake, alert and oriented  Vascular: Dorsalis pedis artery 2/4 bilateral, Posterior tibial artery 2/4 bilateral, skin temperature warm to warm proximal to distal bilateral lower extremities, no varicosities, pedal hair present bilateral.  Neurological: Gross sensation present via light touch bilateral.   Dermatological: Skin is warm, dry, and supple bilateral, Nails 1-10 are tender, long, thick, and discolored with mild subungal debris, improving webspace maceration with superimposed dry scaly skin present bilateral resembling tinea, no open lesions present bilateral, no callus/corns/hyperkeratotic tissue present bilateral. No signs of infection bilateral.  Musculoskeletal: Hammertoe deformities noted bilateral. Muscular strength within normal limits without pain on range of motion. No pain with calf compression bilateral.  Assessment and Plan:  Problem List Items Addressed This Visit    None    Visit Diagnoses    Dermatophytosis of nail    -  Primary   Tinea pedis of both feet       Foot pain, bilateral         -Examined patient.  -Discussed treatment options for painful mycotic nails and tinea pedis -Mechanically debrided and reduced mycotic nails with sterile  nail nipper and dremel nail file without incident. -Cont with Clotrimazole solution for in between toes daily for tinea  -ABN form given to patient to take back to her facility for review and patient to bring back signed next visit -Recommend good hygiene habits -Patient to return in 3 months/as needed for follow up evaluation or sooner if symptoms worsen.  [name], DPM

## 2017-08-02 ENCOUNTER — Ambulatory Visit: Payer: Medicaid Other | Admitting: Sports Medicine

## 2017-08-04 ENCOUNTER — Ambulatory Visit (INDEPENDENT_AMBULATORY_CARE_PROVIDER_SITE_OTHER): Payer: Medicaid Other | Admitting: Sports Medicine

## 2017-08-04 ENCOUNTER — Encounter: Payer: Self-pay | Admitting: Sports Medicine

## 2017-08-04 DIAGNOSIS — B351 Tinea unguium: Secondary | ICD-10-CM | POA: Diagnosis not present

## 2017-08-04 DIAGNOSIS — M79672 Pain in left foot: Secondary | ICD-10-CM

## 2017-08-04 DIAGNOSIS — M79671 Pain in right foot: Secondary | ICD-10-CM

## 2017-08-04 DIAGNOSIS — B353 Tinea pedis: Secondary | ICD-10-CM | POA: Diagnosis not present

## 2017-08-04 MED ORDER — CLOTRIMAZOLE 1 % EX SOLN: 1.0000 "application " | Freq: Two times a day (BID) | CUTANEOUS | 0 refills | Status: DC

## 2017-08-04 NOTE — Progress Notes (Signed)
Patient ID: Susan Giles, female   DOB: January 29, 1966, 51 y.o.   MRN: [id number]  Subjective: Susan BerlinCassandra A Portier is a 51 y.o. female patient seen today in office with complaint of painful thickened and elongated toenails; unable to trim.  Admits to itchy skin in between toes again. Patient has no other pedal complaints at this time.   [facility] and is assisted by caregiver who is in the waiting area today.  Patient Active Problem List   Diagnosis Date Noted  . Alcohol dependence (HCC) 07/05/2014  . Uncomplicated alcohol dependence (HCC)     Current Outpatient Medications on File Prior to Visit  Medication Sig Dispense Refill  . aspirin EC 81 MG tablet Take 1 tablet (81 mg total) by mouth daily.    . divalproex (DEPAKOTE) 250 MG DR tablet Take 2 tablets (500 mg total) by mouth 2 (two) times daily. 120 tablet 0  . docusate sodium (COLACE) 100 MG capsule Take 1 capsule (100 mg total) by mouth daily. 10 capsule 0  . fenofibrate (TRICOR) 145 MG tablet Take 1 tablet (145 mg total) by mouth daily.    Marland Kitchen. FLUoxetine (PROZAC) 20 MG capsule Take 1 capsule (20 mg total) by mouth daily. 30 capsule 0  . hydrOXYzine (ATARAX/VISTARIL) 25 MG tablet Take 1 tablet (25 mg total) by mouth every 6 (six) hours as needed (anxiety). 21 tablet 0  . loratadine (CLARITIN) 10 MG tablet Take 1 tablet (10 mg total) by mouth daily.    . rosuvastatin (CRESTOR) 5 MG tablet Take 1 tablet (5 mg total) by mouth daily.    . traZODone (DESYREL) 50 MG tablet Take 1 tablet (50 mg total) by mouth at bedtime as needed for sleep. 14 tablet 0  . valACYclovir (VALTREX) 500 MG tablet Take 1 tablet (500 mg total) by mouth daily.     No current facility-administered medications on file prior to visit.     Allergies  Allergen Reactions  . Penicillins Other (See Comments)    Unknown     Objective: Physical Exam  General: Well developed, nourished, no acute distress, awake, alert and oriented  Vascular:  Dorsalis pedis artery 2/4 bilateral, Posterior tibial artery 2/4 bilateral, skin temperature warm to warm proximal to distal bilateral lower extremities, no varicosities, pedal hair present bilateral.  Neurological: Gross sensation present via light touch bilateral.   Dermatological: Skin is warm, dry, and supple bilateral, Nails 1-10 are tender, long, thick, and discolored with mild subungal debris, improving webspace maceration with superimposed dry scaly skin present bilateral resembling tinea, no open lesions present bilateral, no callus/corns/hyperkeratotic tissue present bilateral. No signs of infection bilateral.  Musculoskeletal: Hammertoe deformities noted bilateral. Muscular strength within normal limits without pain on range of motion. No pain with calf compression bilateral.  Assessment and Plan:  Problem List Items Addressed This Visit    None    Visit Diagnoses    Dermatophytosis of nail    -  Primary   Relevant Medications   clotrimazole (LOTRIMIN) 1 % external solution   Tinea pedis of both feet       Relevant Medications   clotrimazole (LOTRIMIN) 1 % external solution   Foot pain, bilateral         -Examined patient.  -Discussed treatment options for painful mycotic nails and tinea pedis -Mechanically debrided and reduced mycotic nails with sterile nail nipper and dremel nail file without incident. -Cont with Clotrimazole solution for in between toes daily for tinea; Refilled  this visit -ABN form given again for patient to take back to her facility for review and patient to bring back signed next visit -Recommend good hygiene habits -Patient to return in 3 months/as needed for follow up evaluation or sooner if symptoms worsen.  Asencion Islamitorya Segundo Makela, DPM

## 2017-10-20 ENCOUNTER — Encounter: Payer: Self-pay | Admitting: Sports Medicine

## 2017-10-20 ENCOUNTER — Ambulatory Visit: Payer: Medicaid Other | Admitting: Sports Medicine

## 2017-10-20 DIAGNOSIS — M79676 Pain in unspecified toe(s): Secondary | ICD-10-CM

## 2017-10-20 DIAGNOSIS — B351 Tinea unguium: Secondary | ICD-10-CM

## 2017-10-20 DIAGNOSIS — M79671 Pain in right foot: Secondary | ICD-10-CM

## 2017-10-20 DIAGNOSIS — M79672 Pain in left foot: Secondary | ICD-10-CM

## 2017-10-20 NOTE — Progress Notes (Signed)
Patient ID: Susan Giles, female   DOB: [birth year], 52 y.o.   MRN: [id number]  Subjective: Susan Giles is a 52 y.o. female patient seen today in office with complaint of painful thickened and elongated toenails; unable to trim.  Admits to severe pain in the right first toe and wants the toenail trimmed or cut off. Patient has no other pedal complaints at this time.   Patient is from [facility] group home and is assisted by facility caregiver.  Patient Active Problem List   Diagnosis Date Noted  . Alcohol dependence (HCC) 07/05/2014  . Uncomplicated alcohol dependence (HCC)     Current Outpatient Medications on File Prior to Visit  Medication Sig Dispense Refill  . aspirin EC 81 MG tablet Take 1 tablet (81 mg total) by mouth daily.    . clotrimazole (LOTRIMIN) 1 % external solution Apply 1 application topically 2 (two) times daily. In between toes 30 mL 0  . divalproex (DEPAKOTE) 250 MG DR tablet Take 2 tablets (500 mg total) by mouth 2 (two) times daily. 120 tablet 0  . docusate sodium (COLACE) 100 MG capsule Take 1 capsule (100 mg total) by mouth daily. 10 capsule 0  . fenofibrate (TRICOR) 145 MG tablet Take 1 tablet (145 mg total) by mouth daily.    Marland Kitchen FLUoxetine (PROZAC) 20 MG capsule Take 1 capsule (20 mg total) by mouth daily. 30 capsule 0  . hydrOXYzine (ATARAX/VISTARIL) 25 MG tablet Take 1 tablet (25 mg total) by mouth every 6 (six) hours as needed (anxiety). 21 tablet 0  . loratadine (CLARITIN) 10 MG tablet Take 1 tablet (10 mg total) by mouth daily.    . rosuvastatin (CRESTOR) 5 MG tablet Take 1 tablet (5 mg total) by mouth daily.    . traZODone (DESYREL) 50 MG tablet Take 1 tablet (50 mg total) by mouth at bedtime as needed for sleep. 14 tablet 0  . valACYclovir (VALTREX) 500 MG tablet Take 1 tablet (500 mg total) by mouth daily.     No current facility-administered medications on file prior to visit.     Allergies  Allergen Reactions  . Penicillins Other (See Comments)     Unknown     Objective: Physical Exam  General: Well developed, nourished, no acute distress, awake, alert and oriented  Vascular: Dorsalis pedis artery 2/4 bilateral, Posterior tibial artery 2/4 bilateral, skin temperature warm to warm proximal to distal bilateral lower extremities, no varicosities, pedal hair present bilateral.  Neurological: Gross sensation present via light touch bilateral.   Dermatological: Skin is warm, dry, and supple bilateral, Nails 1-10 are tender, long, thick, and discolored with subungal debris, and distal lifting of the right hallux toenail, no open lesions present bilateral, no callus/corns/hyperkeratotic tissue present bilateral. No signs of infection bilateral.  Musculoskeletal: Hammertoe deformities noted bilateral. Muscular strength within normal limits without pain on range of motion. No pain with calf compression bilateral.  Assessment and Plan:  Problem List Items Addressed This Visit    None    Visit Diagnoses    Dermatophytosis of nail    -  Primary   Foot pain, bilateral         -Examined patient.  -Discussed treatment options for painful mycotic nails -Mechanically debrided and reduced mycotic nails with sterile nail nipper and dremel nail file without incident.  Applied topical lidocaine to right great toe for pain and dispensed a silicone toe cap for patient to wearing shoes for protection at right great toe -ABN form given  again for patient to take back to her facility for review and patient to bring back signed next visit -Recommend good hygiene habits -Patient to return in 2.5 months/as needed for follow up evaluation or sooner if symptoms worsen.  Asencion Islamitorya Jonesha Tsuchiya, DPM

## 2017-11-01 ENCOUNTER — Ambulatory Visit (INDEPENDENT_AMBULATORY_CARE_PROVIDER_SITE_OTHER): Payer: Medicaid Other | Admitting: Sports Medicine

## 2017-11-01 ENCOUNTER — Encounter: Payer: Self-pay | Admitting: Sports Medicine

## 2017-11-01 DIAGNOSIS — B351 Tinea unguium: Secondary | ICD-10-CM

## 2017-11-01 DIAGNOSIS — M79674 Pain in right toe(s): Secondary | ICD-10-CM

## 2017-11-01 MED ORDER — LIDOCAINE 5 % EX OINT: 1.0000 "application " | TOPICAL_OINTMENT | CUTANEOUS | 0 refills | Status: DC | PRN

## 2017-11-01 MED ORDER — CICLOPIROX 8 % EX SOLN: Freq: Every day | CUTANEOUS | 0 refills | Status: DC

## 2017-11-01 NOTE — Progress Notes (Signed)
Patient ID: Geanie Berlinassandra A Bernat, female   DOB: 06/12/1966, [age] y.o.   MRN: [id number]  Subjective: Geanie BerlinCassandra A Kapuscinski is a 52 y.o. female patient seen today in office with complaint of pain to right great toe states that the toe is turning orange and is sore and is worried about it being infected.  Patient just had her nails trimmed 12 days ago with me and states that the area throbs especially when she wears certain shoes states that she had on over the weekend and the toe became more painful.  Denies any other symptoms like nausea vomiting fever chills active drainage increased swelling warmth or redness to right great toe.  [facility] and is assisted by facility caregiver.  Patient Active Problem List   Diagnosis Date Noted  . Alcohol dependence (HCC) 07/05/2014  . Uncomplicated alcohol dependence (HCC)     Current Outpatient Medications on File Prior to Visit  Medication Sig Dispense Refill  . aspirin EC 81 MG tablet Take 1 tablet (81 mg total) by mouth daily.    . clotrimazole (LOTRIMIN) 1 % external solution Apply 1 application topically 2 (two) times daily. In between toes 30 mL 0  . divalproex (DEPAKOTE) 250 MG DR tablet Take 2 tablets (500 mg total) by mouth 2 (two) times daily. 120 tablet 0  . docusate sodium (COLACE) 100 MG capsule Take 1 capsule (100 mg total) by mouth daily. 10 capsule 0  . fenofibrate (TRICOR) 145 MG tablet Take 1 tablet (145 mg total) by mouth daily.    Marland Kitchen. FLUoxetine (PROZAC) 20 MG capsule Take 1 capsule (20 mg total) by mouth daily. 30 capsule 0  . hydrOXYzine (ATARAX/VISTARIL) 25 MG tablet Take 1 tablet (25 mg total) by mouth every 6 (six) hours as needed (anxiety). 21 tablet 0  . loratadine (CLARITIN) 10 MG tablet Take 1 tablet (10 mg total) by mouth daily.    . rosuvastatin (CRESTOR) 5 MG tablet Take 1 tablet (5 mg total) by mouth daily.    . traZODone (DESYREL) 50 MG tablet Take 1 tablet (50 mg total) by mouth at bedtime as needed for  sleep. 14 tablet 0  . valACYclovir (VALTREX) 500 MG tablet Take 1 tablet (500 mg total) by mouth daily.     No current facility-administered medications on file prior to visit.     Allergies  Allergen Reactions  . Penicillins Other (See Comments)    Unknown     Objective: Physical Exam  General: Well developed, nourished, no acute distress, awake, alert and oriented  Vascular: Dorsalis pedis artery 2/4 bilateral, Posterior tibial artery 2/4 bilateral, skin temperature warm to warm proximal to distal bilateral lower extremities, no varicosities, pedal hair present bilateral.  Neurological: Gross sensation present via light touch bilateral.   Dermatological: Skin is warm, dry, and supple bilateral, all toenails are mycotic but there is especially a lot of thickness to the right great toenail with improved distal lifting from me trimming at last visit with mild sensitivity to touch to the toenail no surrounding swelling erythema active drainage no warmth symptoms appear to be consistent with onychomycosis with painful thickened nail.  No open lesions present bilateral, no callus/corns/hyperkeratotic tissue present bilateral. No signs of infection bilateral.  Musculoskeletal: Hammertoe deformities noted bilateral. Muscular strength within normal limits without pain on range of motion. No pain with calf compression bilateral.  Assessment and Plan:  Problem List Items Addressed This Visit    None    Visit Diagnoses  Dermatophytosis of nail    -  Primary   Relevant Medications   ciclopirox (PENLAC) 8 % solution   Toe pain, right       Relevant Medications   lidocaine (XYLOCAINE) 5 % ointment     -Examined patient.  -Discussed treatment options for painful mycotic right great toenail -Applied topical lidocaine and prescribed this for patient to use as needed for pain to toe.  Patient to continue with silicone toe cap for patient to wearing shoes for protection at right great  toe -Prescribed topical antifungal solution to use the nails at bedtime for fungus -Recommend good hygiene habits -Patient to return as scheduled for routine nail care or sooner if symptoms worsen.  Patient must bring in signed ABN at next visit in order to get a nail trim performed by office.  [name], DPM

## 2017-11-02 ENCOUNTER — Ambulatory Visit: Payer: Medicaid Other | Admitting: Sports Medicine

## 2017-12-26 ENCOUNTER — Ambulatory Visit (INDEPENDENT_AMBULATORY_CARE_PROVIDER_SITE_OTHER): Payer: Medicaid Other | Admitting: Podiatry

## 2017-12-26 DIAGNOSIS — M79676 Pain in unspecified toe(s): Secondary | ICD-10-CM | POA: Diagnosis not present

## 2017-12-26 DIAGNOSIS — M79609 Pain in unspecified limb: Principal | ICD-10-CM

## 2017-12-26 DIAGNOSIS — B351 Tinea unguium: Secondary | ICD-10-CM | POA: Diagnosis not present

## 2017-12-26 NOTE — Progress Notes (Signed)
  Subjective:  Patient ID: Susan Giles, female    DOB: 1965/10/13,  MRN: [id number]  Chief Complaint  Patient presents with  . Nail Problem    R great toenail pain x Sunday; 6/10 throbbing pain Tx: none Pt. stated," I am not sure if it's the toe or the toenail that's sore."   52 y.o. female returns for the above complaint. Reports pain in the R great toenail. 6/10 throbbing in nature.  Objective:  There were no vitals filed for this visit. General AA&O x3. Normal mood and affect.  Vascular Pedal pulses palpable.  Neurologic Epicritic sensation grossly intact.  Dermatologic No open lesions. Skin normal texture and turgor. Toenails x 10 elongated, thickened, dystrophic.  Orthopedic: Pain to palpation about the R great toenail.   Assessment & Plan:  Patient was evaluated and treated and all questions answered.  Onychomycosis with pain  -Nails palliatively debrided as below. -Educated on self-care  Procedure: Nail Debridement Rationale: pain  Type of Debridement: manual, sharp debridement. Instrumentation: Nail nipper, rotary burr. Number of Nails: 1     No follow-ups on file.

## 2018-01-01 ENCOUNTER — Ambulatory Visit: Payer: Medicaid Other | Admitting: Podiatry

## 2018-01-05 ENCOUNTER — Ambulatory Visit: Payer: Medicaid Other | Admitting: Sports Medicine

## 2018-02-15 ENCOUNTER — Ambulatory Visit (INDEPENDENT_AMBULATORY_CARE_PROVIDER_SITE_OTHER): Payer: Medicaid Other | Admitting: Sports Medicine

## 2018-02-15 ENCOUNTER — Encounter: Payer: Self-pay | Admitting: Sports Medicine

## 2018-02-15 DIAGNOSIS — M79609 Pain in unspecified limb: Principal | ICD-10-CM

## 2018-02-15 DIAGNOSIS — M79671 Pain in right foot: Secondary | ICD-10-CM

## 2018-02-15 DIAGNOSIS — B351 Tinea unguium: Secondary | ICD-10-CM | POA: Diagnosis not present

## 2018-02-15 DIAGNOSIS — M79676 Pain in unspecified toe(s): Secondary | ICD-10-CM | POA: Diagnosis not present

## 2018-02-15 DIAGNOSIS — M79672 Pain in left foot: Secondary | ICD-10-CM

## 2018-02-15 NOTE — Progress Notes (Signed)
Patient ID: Susan Giles, female   DOB: February 03, 1966, 52 y.o.   MRN: [id number]  Subjective: Susan Giles is a 52 y.o. female patient seen today in office with complaint of painful thickened and elongated toenails; unable to trim.  Admits pain to nails and does not want nails to be trimmed too short. Patient has no other pedal complaints at this time.   Patient is from [facility] group [facility] and is assisted by facility caregiver.  Patient Active Problem List   Diagnosis Date Noted  . Alcohol dependence (HCC) 07/05/2014  . Uncomplicated alcohol dependence (HCC)     Current Outpatient Medications on File Prior to Visit  Medication Sig Dispense Refill  . aspirin EC 81 MG tablet Take 1 tablet (81 mg total) by mouth daily.    . ciclopirox (PENLAC) 8 % solution Apply topically at bedtime. Apply over nail and surrounding skin. Apply daily over previous coat for nail fungus 6.6 mL 0  . clotrimazole (LOTRIMIN) 1 % external solution Apply 1 application topically 2 (two) times daily. In between toes 30 mL 0  . divalproex (DEPAKOTE) 250 MG DR tablet Take 2 tablets (500 mg total) by mouth 2 (two) times daily. 120 tablet 0  . docusate sodium (COLACE) 100 MG capsule Take 1 capsule (100 mg total) by mouth daily. 10 capsule 0  . fenofibrate (TRICOR) 145 MG tablet Take 1 tablet (145 mg total) by mouth daily.    Marland Kitchen FLUoxetine (PROZAC) 20 MG capsule Take 1 capsule (20 mg total) by mouth daily. 30 capsule 0  . hydrOXYzine (ATARAX/VISTARIL) 25 MG tablet Take 1 tablet (25 mg total) by mouth every 6 (six) hours as needed (anxiety). 21 tablet 0  . lidocaine (XYLOCAINE) 5 % ointment Apply 1 application topically as needed. For toe pain 35.44 g 0  . loratadine (CLARITIN) 10 MG tablet Take 1 tablet (10 mg total) by mouth daily.    . rosuvastatin (CRESTOR) 5 MG tablet Take 1 tablet (5 mg total) by mouth daily.    . traZODone (DESYREL) 50 MG tablet Take 1 tablet (50 mg total) by mouth at bedtime as needed for sleep. 14  tablet 0  . valACYclovir (VALTREX) 500 MG tablet Take 1 tablet (500 mg total) by mouth daily.     No current facility-administered medications on file prior to visit.     Allergies  Allergen Reactions  . Penicillins Other (See Comments)    Unknown     Objective: Physical Exam  General: Well developed, nourished, no acute distress, awake, alert and oriented  Vascular: Dorsalis pedis artery 2/4 bilateral, Posterior tibial artery 2/4 bilateral, skin temperature warm to warm proximal to distal bilateral lower extremities, no varicosities, pedal hair present bilateral.  Neurological: Gross sensation present via light touch bilateral.   Dermatological: Skin is warm, dry, and supple bilateral, Nails 1-10 are tender, long, thick, and discolored with subungal debris, and distal lifting of the right hallux toenail, no open lesions present bilateral, no callus/corns/hyperkeratotic tissue present bilateral. No signs of infection bilateral.  Musculoskeletal: Hammertoe deformities noted bilateral. Muscular strength within normal limits without pain on range of motion. No pain with calf compression bilateral.  Assessment and Plan:  Problem List Items Addressed This Visit    None    Visit Diagnoses    Pain due to onychomycosis of nail    -  Primary   Foot pain, bilateral         -Examined patient.  -Discussed treatment options for painful mycotic  nails -Mechanically debrided and reduced mycotic nails with sterile nail nipper and dremel nail file without incident to patient's tolerance.  -Patient to return in 3 months for nail trim or sooner if symptoms worsen.  Asencion Islamitorya Azion Centrella, DPM

## 2018-04-27 ENCOUNTER — Ambulatory Visit: Payer: Medicaid Other | Admitting: Sports Medicine

## 2018-05-14 ENCOUNTER — Ambulatory Visit (INDEPENDENT_AMBULATORY_CARE_PROVIDER_SITE_OTHER): Payer: Medicaid Other | Admitting: Podiatry

## 2018-05-14 ENCOUNTER — Encounter: Payer: Self-pay | Admitting: Podiatry

## 2018-05-14 VITALS — BP 123/86 | HR 73 | Resp 16

## 2018-05-14 DIAGNOSIS — B351 Tinea unguium: Secondary | ICD-10-CM | POA: Diagnosis not present

## 2018-05-14 DIAGNOSIS — M79676 Pain in unspecified toe(s): Secondary | ICD-10-CM | POA: Diagnosis not present

## 2018-05-14 DIAGNOSIS — M79609 Pain in unspecified limb: Principal | ICD-10-CM

## 2018-05-14 NOTE — Progress Notes (Signed)
Subjective:  Patient ID: Susan Giles, female    DOB: 1965-12-20,  MRN: [id number]  Chief Complaint  Patient presents with  . debride    BL nail trimming -pt complaints of R hallux pain x couple days -no injury Tx: none    52 y.o. female presents with the above complaint.  Reports painfully elongated nails to both feet, worst on the right great toe which has been sore for several days and making it hard for her to walk.  Review of Systems: Negative except as noted in the HPI. Denies N/V/F/Ch.  Past Medical History:  Diagnosis Date  . High cholesterol   . Hypertension     Current Outpatient Medications:  .  aspirin EC 81 MG tablet, Take 1 tablet (81 mg total) by mouth daily., Disp: , Rfl:  .  ciclopirox (PENLAC) 8 % solution, Apply topically at bedtime. Apply over nail and surrounding skin. Apply daily over previous coat for nail fungus, Disp: 6.6 mL, Rfl: 0 .  clotrimazole (LOTRIMIN) 1 % external solution, Apply 1 application topically 2 (two) times daily. In between toes, Disp: 30 mL, Rfl: 0 .  divalproex (DEPAKOTE) 250 MG DR tablet, Take 2 tablets (500 mg total) by mouth 2 (two) times daily., Disp: 120 tablet, Rfl: 0 .  docusate sodium (COLACE) 100 MG capsule, Take 1 capsule (100 mg total) by mouth daily., Disp: 10 capsule, Rfl: 0 .  fenofibrate (TRICOR) 145 MG tablet, Take 1 tablet (145 mg total) by mouth daily., Disp: , Rfl:  .  FLUoxetine (PROZAC) 20 MG capsule, Take 1 capsule (20 mg total) by mouth daily., Disp: 30 capsule, Rfl: 0 .  hydrOXYzine (ATARAX/VISTARIL) 25 MG tablet, Take 1 tablet (25 mg total) by mouth every 6 (six) hours as needed (anxiety)., Disp: 21 tablet, Rfl: 0 .  lidocaine (XYLOCAINE) 5 % ointment, Apply 1 application topically as needed. For toe pain, Disp: 35.44 g, Rfl: 0 .  loratadine (CLARITIN) 10 MG tablet, Take 1 tablet (10 mg total) by mouth daily., Disp: , Rfl:  .  rosuvastatin (CRESTOR) 5 MG tablet, Take 1 tablet (5 mg total) by mouth daily.,  Disp: , Rfl:  .  traZODone (DESYREL) 50 MG tablet, Take 1 tablet (50 mg total) by mouth at bedtime as needed for sleep., Disp: 14 tablet, Rfl: 0 .  valACYclovir (VALTREX) 500 MG tablet, Take 1 tablet (500 mg total) by mouth daily., Disp: , Rfl:   Social History   Tobacco Use  Smoking Status Current Every Day Smoker  . Types: Cigarettes  Smokeless Tobacco Never Used    Allergies  Allergen Reactions  . Penicillin G   . Penicillins Other (See Comments)    Unknown    Objective:   Vitals:   05/14/18 0925  BP: 123/86  Pulse: 73  Resp: 16   There is no height or weight on file to calculate BMI. Constitutional Well developed. Well nourished.  Vascular Dorsalis pedis pulses palpable bilaterally. Posterior tibial pulses palpable bilaterally. Capillary refill normal to all digits.  No cyanosis or clubbing noted. Pedal hair growth normal.  Neurologic Normal speech. Oriented to person, place, and time. Epicritic sensation to light touch grossly present bilaterally.  Dermatologic Nails elongated dystrophic pain to palpation No open wounds. No skin lesions.  Orthopedic: Normal joint ROM without pain or crepitus bilaterally. No visible deformities. No bony tenderness.   Radiographs: None Assessment:   1. Pain due to onychomycosis of nail    Plan:  Patient was evaluated and  treated and all questions answered.  Onychomycosis with pain -Nails palliatively debridement as below -Educated on self-care  Procedure: Nail Debridement Rationale: Pain Type of Debridement: manual, sharp debridement. Instrumentation: Nail nipper, rotary burr. Number of Nails: 10    No follow-ups on file.

## 2018-05-18 ENCOUNTER — Ambulatory Visit: Payer: Medicaid Other | Admitting: Sports Medicine

## 2019-03-08 ENCOUNTER — Ambulatory Visit (INDEPENDENT_AMBULATORY_CARE_PROVIDER_SITE_OTHER): Payer: Medicaid Other | Admitting: Sports Medicine

## 2019-03-08 ENCOUNTER — Other Ambulatory Visit: Payer: Self-pay

## 2019-03-08 ENCOUNTER — Encounter: Payer: Self-pay | Admitting: Sports Medicine

## 2019-03-08 DIAGNOSIS — M79672 Pain in left foot: Secondary | ICD-10-CM

## 2019-03-08 DIAGNOSIS — B351 Tinea unguium: Secondary | ICD-10-CM

## 2019-03-08 DIAGNOSIS — M79676 Pain in unspecified toe(s): Secondary | ICD-10-CM

## 2019-03-08 DIAGNOSIS — M79671 Pain in right foot: Secondary | ICD-10-CM

## 2019-03-08 NOTE — Progress Notes (Signed)
Patient ID: Susan Berlinassandra A Giles, female   DOB: [birth year], 53 y.o.   MRN: [id number]   Subjective: Susan BerlinCassandra A Gayler is a 53 y.o. female patient seen today in office with complaint of painful thickened and elongated toenails; unable to trim.  No other issues noted.  Patient is from [facility] and is assisted by caregiver who is with her today.  Patient Active Problem List   Diagnosis Date Noted  . Alcohol dependence (HCC) 07/05/2014  . Uncomplicated alcohol dependence (HCC)     Current Outpatient Medications on File Prior to Visit  Medication Sig Dispense Refill  . aspirin EC 81 MG tablet Take 1 tablet (81 mg total) by mouth daily.    . ciclopirox (PENLAC) 8 % solution Apply topically at bedtime. Apply over nail and surrounding skin. Apply daily over previous coat for nail fungus 6.6 mL 0  . clotrimazole (LOTRIMIN) 1 % external solution Apply 1 application topically 2 (two) times daily. In between toes 30 mL 0  . divalproex (DEPAKOTE) 250 MG DR tablet Take 2 tablets (500 mg total) by mouth 2 (two) times daily. 120 tablet 0  . docusate sodium (COLACE) 100 MG capsule Take 1 capsule (100 mg total) by mouth daily. 10 capsule 0  . fenofibrate (TRICOR) 145 MG tablet Take 1 tablet (145 mg total) by mouth daily.    Marland Kitchen. FLUoxetine (PROZAC) 20 MG capsule Take 1 capsule (20 mg total) by mouth daily. 30 capsule 0  . hydrOXYzine (ATARAX/VISTARIL) 25 MG tablet Take 1 tablet (25 mg total) by mouth every 6 (six) hours as needed (anxiety). 21 tablet 0  . lidocaine (XYLOCAINE) 5 % ointment Apply 1 application topically as needed. For toe pain 35.44 g 0  . loratadine (CLARITIN) 10 MG tablet Take 1 tablet (10 mg total) by mouth daily.    . rosuvastatin (CRESTOR) 5 MG tablet Take 1 tablet (5 mg total) by mouth daily.    . traZODone (DESYREL) 50 MG tablet Take 1 tablet (50 mg total) by mouth at bedtime as needed for sleep. 14 tablet 0  . valACYclovir (VALTREX) 500 MG tablet Take 1 tablet (500 mg total) by  mouth daily.     No current facility-administered medications on file prior to visit.     Allergies  Allergen Reactions  . Penicillin G   . Penicillins Other (See Comments)    Unknown     Objective: Physical Exam  General: Well developed, nourished, no acute distress, awake, alert and oriented  Vascular: Dorsalis pedis artery 2/4 bilateral, Posterior tibial artery 2/4 bilateral, skin temperature warm to warm proximal to distal bilateral lower extremities, no varicosities, pedal hair present bilateral.  Neurological: Gross sensation present via light touch bilateral.   Dermatological: Skin is warm, dry, and supple bilateral, Nails 1-10 are tender, long, thick, and discolored with mild subungal debris, improving webspace maceration with superimposed dry scaly skin present bilateral resembling tinea, no open lesions present bilateral, no callus/corns/hyperkeratotic tissue present bilateral. No signs of infection bilateral.  Musculoskeletal: Hammertoe deformities noted bilateral. Muscular strength within normal limits without pain on range of motion. No pain with calf compression bilateral.  Assessment and Plan:  Problem List Items Addressed This Visit    None    Visit Diagnoses    Pain due to onychomycosis of nail    -  Primary   Foot pain, bilateral         -Examined patient.  -Discussed treatment options for painful mycotic nails  -Mechanically debrided and  reduced mycotic nails with sterile nail nipper and dremel nail file without incident. -Recommend good hygiene habits -Patient to return in 3 months/as needed for follow up evaluation or sooner if symptoms worsen.  Landis Martins, DPM

## 2019-03-15 ENCOUNTER — Ambulatory Visit: Payer: Medicaid Other | Admitting: Sports Medicine

## 2019-04-17 ENCOUNTER — Ambulatory Visit (INDEPENDENT_AMBULATORY_CARE_PROVIDER_SITE_OTHER): Payer: Medicaid Other | Admitting: Sports Medicine

## 2019-04-17 ENCOUNTER — Other Ambulatory Visit: Payer: Self-pay

## 2019-04-17 ENCOUNTER — Encounter: Payer: Self-pay | Admitting: Sports Medicine

## 2019-04-17 VITALS — Temp 96.9°F | Resp 16

## 2019-04-17 DIAGNOSIS — M79676 Pain in unspecified toe(s): Secondary | ICD-10-CM | POA: Diagnosis not present

## 2019-04-17 DIAGNOSIS — B351 Tinea unguium: Secondary | ICD-10-CM | POA: Diagnosis not present

## 2019-04-17 NOTE — Progress Notes (Signed)
Patient ID: Susan Giles, female   DOB: 05-13-1966, 53 y.o.   MRN: [id number]   Subjective: Susan Giles is a 53 y.o. female patient seen today in office with complaint of painful thickened and elongated toenails; unable to trim.  Has some soreness especially on left 1st toe, no drainage, swelling, redness or warmth. No other issues noted.  Patient is from [facility] and is assisted by caregiver who is with her today who reports that she has not been soaking with Epsom salt only tried for 1 min.   Patient Active Problem List   Diagnosis Date Noted  . Alcohol dependence ([place]) 07/05/2014  . Uncomplicated alcohol dependence ([place])     Current Outpatient Medications on File Prior to Visit  Medication Sig Dispense Refill  . aspirin EC 81 MG tablet Take 1 tablet (81 mg total) by mouth daily.    Marland Kitchen atorvastatin (LIPITOR) 40 MG tablet     . ciclopirox (PENLAC) 8 % solution Apply topically at bedtime. Apply over nail and surrounding skin. Apply daily over previous coat for nail fungus 6.6 mL 0  . clotrimazole (LOTRIMIN) 1 % external solution Apply 1 application topically 2 (two) times daily. In between toes 30 mL 0  . divalproex (DEPAKOTE) 250 MG DR tablet Take 2 tablets (500 mg total) by mouth 2 (two) times daily. 120 tablet 0  . docusate sodium (COLACE) 100 MG capsule Take 1 capsule (100 mg total) by mouth daily. 10 capsule 0  . fenofibrate (TRICOR) 145 MG tablet Take 1 tablet (145 mg total) by mouth daily.    Marland Kitchen FLUoxetine (PROZAC) 20 MG capsule Take 1 capsule (20 mg total) by mouth daily. 30 capsule 0  . hydrOXYzine (ATARAX/VISTARIL) 25 MG tablet Take 1 tablet (25 mg total) by mouth every 6 (six) hours as needed (anxiety). 21 tablet 0  . lidocaine (XYLOCAINE) 5 % ointment Apply 1 application topically as needed. For toe pain 35.44 g 0  . loratadine (CLARITIN) 10 MG tablet Take 1 tablet (10 mg total) by mouth daily.    . rosuvastatin (CRESTOR) 5 MG tablet Take 1 tablet (5 mg total)  by mouth daily.    . traZODone (DESYREL) 50 MG tablet Take 1 tablet (50 mg total) by mouth at bedtime as needed for sleep. 14 tablet 0  . valACYclovir (VALTREX) 500 MG tablet Take 1 tablet (500 mg total) by mouth daily.     No current facility-administered medications on file prior to visit.     Allergies  Allergen Reactions  . Penicillin G   . Penicillins Other (See Comments)    Unknown     Objective: Physical Exam  General: Well developed, nourished, no acute distress, awake, alert and oriented  Vascular: Dorsalis pedis artery 2/4 bilateral, Posterior tibial artery 2/4 bilateral, skin temperature warm to warm proximal to distal bilateral lower extremities, no varicosities, pedal hair present bilateral.  Neurological: Gross sensation present via light touch bilateral.   Dermatological: Skin is warm, dry, and supple bilateral, Nails 1-10 are tender, long, thick, and discolored with mild subungal debris, improving webspace maceration with superimposed dry scaly skin present bilateral resembling tinea that is much improved, no open lesions present bilateral, no callus/corns/hyperkeratotic tissue present bilateral. No signs of infection bilateral.  Musculoskeletal: Hammertoe deformities noted bilateral. Muscular strength within normal limits without pain on range of motion. No pain with calf compression bilateral.  Assessment and Plan:  Problem List Items Addressed This Visit    None  Visit Diagnoses    Pain due to onychomycosis of nail    -  Primary     -Examined patient.  -Discussed treatment options for painful mycotic nails  -Mechanically debrided and reduced mycotic nails with sterile nail nipper and dremel nail file without incident -Patient to return in 3 months/as needed for follow up evaluation or sooner if symptoms worsen.  Asencion Islamitorya Vicci Reder, DPM

## 2019-06-07 ENCOUNTER — Ambulatory Visit: Payer: Medicaid Other | Admitting: Sports Medicine

## 2019-06-24 ENCOUNTER — Encounter: Payer: Self-pay | Admitting: Gastroenterology

## 2019-06-27 ENCOUNTER — Other Ambulatory Visit: Payer: Self-pay

## 2019-06-27 ENCOUNTER — Ambulatory Visit (INDEPENDENT_AMBULATORY_CARE_PROVIDER_SITE_OTHER): Payer: Medicaid Other | Admitting: Sports Medicine

## 2019-06-27 ENCOUNTER — Encounter: Payer: Self-pay | Admitting: Sports Medicine

## 2019-06-27 DIAGNOSIS — M79609 Pain in unspecified limb: Secondary | ICD-10-CM

## 2019-06-27 DIAGNOSIS — M79676 Pain in unspecified toe(s): Secondary | ICD-10-CM

## 2019-06-27 DIAGNOSIS — B351 Tinea unguium: Secondary | ICD-10-CM | POA: Diagnosis not present

## 2019-06-27 MED ORDER — CICLOPIROX 8 % EX SOLN: Freq: Every day | CUTANEOUS | 0 refills | Status: DC

## 2019-06-27 NOTE — Progress Notes (Signed)
Patient ID: Odella Aquas, female   DOB: 1966/06/06, 53 y.o.   MRN: [id number]   Subjective: FLEURETTE WOOLBRIGHT is a 53 y.o. female patient seen today in office with complaint of painful thickened and elongated toenails; unable to trim.  Patient denies any pain or changes to toes.  Patient is from [facility] and is assisted by caregiver who is with her today who reports that her manager is making him come in routine basis to keep her feet checked.  Patient Active Problem List   Diagnosis Date Noted  . Alcohol dependence ([place]) 07/05/2014  . Uncomplicated alcohol dependence ([place])     Current Outpatient Medications on File Prior to Visit  Medication Sig Dispense Refill  . aspirin EC 81 MG tablet Take 1 tablet (81 mg total) by mouth daily.    Marland Kitchen atorvastatin (LIPITOR) 40 MG tablet     . clotrimazole (LOTRIMIN) 1 % external solution Apply 1 application topically 2 (two) times daily. In between toes 30 mL 0  . divalproex (DEPAKOTE) 250 MG DR tablet Take 2 tablets (500 mg total) by mouth 2 (two) times daily. 120 tablet 0  . docusate sodium (COLACE) 100 MG capsule Take 1 capsule (100 mg total) by mouth daily. 10 capsule 0  . fenofibrate (TRICOR) 145 MG tablet Take 1 tablet (145 mg total) by mouth daily.    Marland Kitchen FLUoxetine (PROZAC) 20 MG capsule Take 1 capsule (20 mg total) by mouth daily. 30 capsule 0  . hydrOXYzine (ATARAX/VISTARIL) 25 MG tablet Take 1 tablet (25 mg total) by mouth every 6 (six) hours as needed (anxiety). 21 tablet 0  . lidocaine (XYLOCAINE) 5 % ointment Apply 1 application topically as needed. For toe pain 35.44 g 0  . loratadine (CLARITIN) 10 MG tablet Take 1 tablet (10 mg total) by mouth daily.    . rosuvastatin (CRESTOR) 5 MG tablet Take 1 tablet (5 mg total) by mouth daily.    . traZODone (DESYREL) 50 MG tablet Take 1 tablet (50 mg total) by mouth at bedtime as needed for sleep. 14 tablet 0  . valACYclovir (VALTREX) 500 MG tablet Take 1 tablet (500 mg total) by mouth  daily.     No current facility-administered medications on file prior to visit.     Allergies  Allergen Reactions  . Penicillin G   . Penicillins Other (See Comments)    Unknown     Objective: Physical Exam  General: Well developed, nourished, no acute distress, awake, alert and oriented  Vascular: Dorsalis pedis artery 2/4 bilateral, Posterior tibial artery 2/4 bilateral, skin temperature warm to warm proximal to distal bilateral lower extremities, no varicosities, pedal hair present bilateral.  Neurological: Gross sensation present via light touch bilateral.   Dermatological: Skin is warm, dry, and supple bilateral, Nails 1-10 are tender, long, thick, and discolored with mild subungal debris, improving webspace maceration with superimposed dry scaly skin present bilateral resembling tinea that is much improved, no open lesions present bilateral, no callus/corns/hyperkeratotic tissue present bilateral. No signs of infection bilateral.  Musculoskeletal: Hammertoe deformities noted bilateral. Muscular strength within normal limits without pain on range of motion. No pain with calf compression bilateral.  Assessment and Plan:  Problem List Items Addressed This Visit    None    Visit Diagnoses    Dermatophytosis of nail       Relevant Medications   ciclopirox (PENLAC) 8 % solution     -Examined patient.  -Discussed treatment options for painful mycotic nails  -  Mechanically debrided and reduced mycotic nails with sterile nail nipper and dremel nail file without incident to patient's tolerance -Prescribed Penlac solution to use as instructed -Advised self filing of nails in between trimmings -Patient to return in 3 months/as needed for follow up evaluation or sooner if symptoms worsen.  [name], DPM

## 2019-07-16 ENCOUNTER — Ambulatory Visit (INDEPENDENT_AMBULATORY_CARE_PROVIDER_SITE_OTHER): Payer: Medicaid Other | Admitting: Gastroenterology

## 2019-07-16 ENCOUNTER — Other Ambulatory Visit: Payer: Self-pay

## 2019-07-16 ENCOUNTER — Encounter: Payer: Self-pay | Admitting: Gastroenterology

## 2019-07-16 VITALS — BP 128/84 | HR 93 | Temp 97.9°F | Ht 61.0 in | Wt 116.4 lb

## 2019-07-16 DIAGNOSIS — K625 Hemorrhage of anus and rectum: Secondary | ICD-10-CM

## 2019-07-16 DIAGNOSIS — R634 Abnormal weight loss: Secondary | ICD-10-CM | POA: Diagnosis not present

## 2019-07-16 DIAGNOSIS — R1030 Lower abdominal pain, unspecified: Secondary | ICD-10-CM

## 2019-07-16 DIAGNOSIS — D509 Iron deficiency anemia, unspecified: Secondary | ICD-10-CM

## 2019-07-16 MED ORDER — FLUTICASONE PROPIONATE 0.05 % EX CREA: TOPICAL_CREAM | Freq: Two times a day (BID) | CUTANEOUS | 2 refills | Status: AC

## 2019-07-16 NOTE — Patient Instructions (Addendum)
If you are age 55 or older, your body mass index should be between 23-30. Your Body mass index is 21.99 kg/m. If this is out of the aforementioned range listed, please consider follow up with your Primary Care Provider.  If you are age 25 or younger, your body mass index should be between 19-25. Your Body mass index is 21.99 kg/m. If this is out of the aformentioned range listed, please consider follow up with your Primary Care Provider.   Please go to the lab at [facility] ([place] You will need to go to level "B", you do not need an appointment for this. Hours available are 7:30 am - 4:30 pm. You will need to do this on Wednesday 07/17/19 or Monday 07/22/19. If you do not have this completed then you CT Scan will be canceled   We have sent the following medications to your pharmacy for you to pick up at your convenience: Fluticasone  Please purchase the following medications over the counter and take as directed: Benefiber 1 tablespoon once daily with 8 ounces of water  Colace twice daily  Increase water intake Avoid NSAIDS.   You [facility] [place] [id number] [address] [facility]   You are scheduled on 07/23/19 at [facility] should arrive 15 minutes prior to your appointment time for registration. Please follow the written instructions below on the day of your exam:  WARNING: IF YOU ARE ALLERGIC TO IODINE/X-RAY DYE, PLEASE NOTIFY RADIOLOGY IMMEDIATELY AT [phone]! YOU WILL BE GIVEN A 13 HOUR PREMEDICATION PREP.  1) Do not eat or drink anything after 7am (4 hours prior to your test) 2) You have been given 2 bottles of oral contrast to drink. The solution may taste better if refrigerated, but do NOT add ice or any other liquid to this solution. Shake well before drinking.    Drink 1 bottle of contrast @ 9am (2 hours prior to your exam)  Drink 1 bottle of  contrast @ 10am (1 hour prior to your exam)  You may take any medications as prescribed with a small amount of water, if necessary. If you take any of the following medications: METFORMIN, GLUCOPHAGE, GLUCOVANCE, AVANDAMET, RIOMET, FORTAMET, [place] MET, JANUMET, GLUMETZA or METAGLIP, you MAY be asked to HOLD this medication 48 hours AFTER the exam.  The purpose of you drinking the oral contrast is to aid in the visualization of your intestinal tract. The contrast solution may cause some diarrhea. Depending on your individual set of symptoms, you may also receive an intravenous injection of x-ray contrast/dye. Plan on being at [facility] for 30 minutes or longer, depending on the type of exam you are having performed.  This test typically takes 30-45 minutes to complete.  If you have any questions regarding your exam or if you need to reschedule, you may call the CT department at [phone] between the hours of 8:00 am and 5:00 pm, Monday-Friday.  ________________________________________________________________________    Thank you,  Dr. Jackquline Denmark

## 2019-07-16 NOTE — Progress Notes (Signed)
Chief Complaint:   Referring Provider:  Galvin ProfferHague, Imran P, MD      ASSESSMENT AND PLAN;   #1. Rectal bleeding.  Likely d/t internal hemorrhoids.  Rule out other causes. #2. Lower abdo pain with associated constipation #3. Weight loss #4. ? IDA  Plan: - Please obtain previous records (blood work) from Dr. Stefanie LibelHaque's office. - Proceed with CT AP with PO and IV. - Benefiber 1 TBS p.o. QD with 8 oz of water. - fluticasone cream 0.05% generic 30g 1 bid PR x 10 days, 2 refills - Increase colace 1 tab po bid. - Increase water. - Avoid NSAIDs. - Call if still with problems in 2 weeks. - FU in 4-6 weeks.  I have discussed with the caregiver and detail.    HPI:    Susan Giles is a 53 y.o. female  Seen at request of Dr. Ardelle ParkHaque I do not have any records currently from Dr. Ardelle ParkHaque  Per patient's caregiver, she has been having constipation associated with rectal bleeding.  Mostly bright red in color at the frequency of 1-2 times per week.  She had blood work which showed anemia.  She has lost weight (documented 20lb over last 1 year).  She has some abdominal bloating with mild lower  abdominal pain.  She was given MiraLAX previously which resulted in diarrhea.  She is currently on Colace.  He was also given Linzess in the past which was stopped d/t INS not covering it.  Has never been on Amitiza.  No nausea, vomiting, heartburn, regurgitation, odynophagia or dysphagia.  No fever chills or night sweats.  She has been given Ensure to maintain weight.  She does have previous history of cocaine and alcohol use.  But has not used since she has been in the facility for over last 3-4 years.  Had blood work performed recently at Dr Liz ClaiborneHaque's office.  Past GI procedures: -Colonoscopy 02/06/2017: ([facility])Highly redundant colon.  Quality of preparation was good.  Small internal hemorrhoids.  Otherwise normal to TI.  Past Medical History:  Diagnosis Date  . Allergic rhinitis   . Amenorrhea    . Anorexia   . BRBPR (bright red blood per rectum)   . Chronic constipation   . Depression   . GERD (gastroesophageal reflux disease)   . Hemorrhoids   . High cholesterol   . History of behavioral and mental health problems   . HSV-1 (herpes simplex virus 1) infection   . Hyperlipidemia   . Hypertension   . Hypertension   . Hypothyroidism   . Incontinence   . Migraine   . Schizophrenia (HCC)   . Substance abuse (HCC)    alcohol, crack, cocaine  . Suicidal thoughts   . Vitamin B12 deficiency     Past Surgical History:  Procedure Laterality Date  . COLONOSCOPY  02/06/2017   Small internal hemorrhoids (likely etiology of rectal bleeding-no active bleeding) Otherwise, normal colonoscopy to terminal ileum  . TOTAL ABDOMINAL HYSTERECTOMY    . TUBAL LIGATION    . WRIST SURGERY Right    To remove cyst    Family History  Problem Relation Age of Onset  . Other Mother        Has bleeding disorder  . Colon cancer Neg Hx   . Esophageal cancer Neg Hx     Social History   Tobacco Use  . Smoking status: Former Smoker    Types: Cigarettes  . Smokeless tobacco: Never Used  Substance Use Topics  .  Alcohol use: Not Currently    Comment: heavy  . Drug use: Not Currently    Types: Cocaine    Current Outpatient Medications  Medication Sig Dispense Refill  . aspirin EC 81 MG tablet Take 1 tablet (81 mg total) by mouth daily.    Marland Kitchen atorvastatin (LIPITOR) 40 MG tablet     . ciclopirox (PENLAC) 8 % solution Apply topically at bedtime. Apply over nail and surrounding skin. Apply daily over previous coat for nail fungus and file on day 7 and then repeat 6.6 mL 0  . clotrimazole (LOTRIMIN) 1 % external solution Apply 1 application topically 2 (two) times daily. In between toes 30 mL 0  . divalproex (DEPAKOTE) 250 MG DR tablet Take 2 tablets (500 mg total) by mouth 2 (two) times daily. 120 tablet 0  . docusate sodium (COLACE) 100 MG capsule Take 1 capsule (100 mg total) by mouth daily.  10 capsule 0  . fenofibrate (TRICOR) 145 MG tablet Take 1 tablet (145 mg total) by mouth daily.    Marland Kitchen FLUoxetine (PROZAC) 20 MG capsule Take 1 capsule (20 mg total) by mouth daily. 30 capsule 0  . hydrOXYzine (ATARAX/VISTARIL) 25 MG tablet Take 1 tablet (25 mg total) by mouth every 6 (six) hours as needed (anxiety). 21 tablet 0  . lidocaine (XYLOCAINE) 5 % ointment Apply 1 application topically as needed. For toe pain 35.44 g 0  . loratadine (CLARITIN) 10 MG tablet Take 1 tablet (10 mg total) by mouth daily.    . rosuvastatin (CRESTOR) 5 MG tablet Take 1 tablet (5 mg total) by mouth daily.    . traZODone (DESYREL) 50 MG tablet Take 1 tablet (50 mg total) by mouth at bedtime as needed for sleep. 14 tablet 0  . valACYclovir (VALTREX) 500 MG tablet Take 1 tablet (500 mg total) by mouth daily.     No current facility-administered medications for this visit.     Allergies  Allergen Reactions  . Penicillin G   . Penicillins Other (See Comments)    Unknown     Review of Systems:  neg     Physical Exam:    BP 128/84   Pulse 93   Temp 97.9 F (36.6 C)   Ht 5\' 1"  (1.549 m)   Wt 116 lb 6 oz (52.8 kg)   LMP  (LMP Unknown)   BMI 21.99 kg/m  Filed Weights   07/16/19 1429  Weight: 116 lb 6 oz (52.8 kg)   Constitutional:  Well-developed, in no acute distress. Psychiatric: Normal mood and affect. Behavior is normal. HEENT: Pupils normal.  Conjunctivae are normal. No scleral icterus. Neck supple.  Cardiovascular: Normal rate, regular rhythm. No edema Pulmonary/chest: Effort normal and breath sounds normal. No wheezing, rales or rhonchi. Abdominal: Soft, nondistended. Nontender. Bowel sounds active throughout. There are no masses palpable. No hepatomegaly. Rectal: She wanted to hold off. Neurological: Alert and oriented to person place and time. Skin: Skin is warm and dry. No rashes noted.  Data Reviewed: I have personally reviewed following labs and imaging studies  CBC: CBC Latest  Ref Rng & Units 07/04/2014  WBC 4.0 - 10.5 K/uL 8.7  Hemoglobin 12.0 - 15.0 g/dL 12.3  Hematocrit 36.0 - 46.0 % 36.8  Platelets 150 - 400 K/uL 333    CMP: CMP Latest Ref Rng & Units 07/04/2014  Glucose 70 - 99 mg/dL 87  BUN 6 - 23 mg/dL 13  Creatinine 0.50 - 1.10 mg/dL 0.98  Sodium 137 - 147  mEq/L 141  Potassium 3.7 - 5.3 mEq/L 4.5  Chloride 96 - 112 mEq/L 102  CO2 19 - 32 mEq/L 24  Calcium 8.4 - 10.5 mg/dL 9.6  Total Protein 6.0 - 8.3 g/dL 7.7  Total Bilirubin 0.3 - 1.2 mg/dL <3.5(K)  Alkaline Phos 39 - 117 U/L 37(L)  AST 0 - 37 U/L 30  ALT 0 - 35 U/L 14  25 minutes spent with the patient today. Greater than 50% was spent in counseling and coordination of care with the patient    Edman Circle, MD 07/16/2019, 2:44 PM  Cc: Galvin Proffer, MD

## 2019-07-23 ENCOUNTER — Ambulatory Visit (HOSPITAL_BASED_OUTPATIENT_CLINIC_OR_DEPARTMENT_OTHER): Admission: RE | Admit: 2019-07-23 | Payer: Medicaid Other | Source: Ambulatory Visit

## 2019-07-24 ENCOUNTER — Ambulatory Visit: Payer: Medicaid Other | Admitting: Sports Medicine

## 2019-07-25 ENCOUNTER — Other Ambulatory Visit (INDEPENDENT_AMBULATORY_CARE_PROVIDER_SITE_OTHER): Payer: Medicaid Other

## 2019-07-25 ENCOUNTER — Ambulatory Visit (HOSPITAL_BASED_OUTPATIENT_CLINIC_OR_DEPARTMENT_OTHER): Admission: RE | Admit: 2019-07-25 | Payer: Medicaid Other | Source: Ambulatory Visit

## 2019-07-25 DIAGNOSIS — K625 Hemorrhage of anus and rectum: Secondary | ICD-10-CM

## 2019-07-25 DIAGNOSIS — D509 Iron deficiency anemia, unspecified: Secondary | ICD-10-CM

## 2019-07-25 DIAGNOSIS — R634 Abnormal weight loss: Secondary | ICD-10-CM | POA: Diagnosis not present

## 2019-07-25 DIAGNOSIS — R1030 Lower abdominal pain, unspecified: Secondary | ICD-10-CM

## 2019-07-25 LAB — COMPREHENSIVE METABOLIC PANEL
ALT: 14 U/L (ref 0–35)
AST: 37 U/L (ref 0–37)
Albumin: 3.4 g/dL — ABNORMAL LOW (ref 3.5–5.2)
Alkaline Phosphatase: 61 U/L (ref 39–117)
BUN: 16 mg/dL (ref 6–23)
CO2: 27 mEq/L (ref 19–32)
Calcium: 9 mg/dL (ref 8.4–10.5)
Chloride: 106 mEq/L (ref 96–112)
Creatinine, Ser: 1.26 mg/dL — ABNORMAL HIGH (ref 0.40–1.20)
GFR: 53.26 mL/min — ABNORMAL LOW (ref >60.00)
Glucose, Bld: 80 mg/dL (ref 70–99)
Potassium: 4.2 mEq/L (ref 3.5–5.1)
Sodium: 142 mEq/L (ref 135–145)
Total Bilirubin: 0.5 mg/dL (ref 0.2–1.2)
Total Protein: 6.7 g/dL (ref 6.0–8.3)

## 2019-07-29 ENCOUNTER — Other Ambulatory Visit: Payer: Self-pay

## 2019-07-29 ENCOUNTER — Ambulatory Visit (HOSPITAL_BASED_OUTPATIENT_CLINIC_OR_DEPARTMENT_OTHER)
Admission: RE | Admit: 2019-07-29 | Discharge: 2019-07-29 | Disposition: A | Discharge status: Home / Self Care | Payer: Medicaid Other | Source: Ambulatory Visit | Attending: Gastroenterology | Admitting: Gastroenterology

## 2019-07-29 DIAGNOSIS — K625 Hemorrhage of anus and rectum: Secondary | ICD-10-CM | POA: Insufficient documentation

## 2019-07-29 DIAGNOSIS — R634 Abnormal weight loss: Secondary | ICD-10-CM | POA: Insufficient documentation

## 2019-07-29 DIAGNOSIS — R1030 Lower abdominal pain, unspecified: Secondary | ICD-10-CM | POA: Diagnosis present

## 2019-07-29 DIAGNOSIS — D509 Iron deficiency anemia, unspecified: Secondary | ICD-10-CM | POA: Insufficient documentation

## 2019-07-29 MED ORDER — IOHEXOL 300 MG/ML  SOLN
100.0000 mL | Freq: Once | INTRAMUSCULAR | Status: AC | PRN
  Administered 2019-07-29: 100 mL via INTRAVENOUS

## 2019-09-18 ENCOUNTER — Ambulatory Visit (INDEPENDENT_AMBULATORY_CARE_PROVIDER_SITE_OTHER): Payer: Medicaid Other | Admitting: Sports Medicine

## 2019-09-18 ENCOUNTER — Other Ambulatory Visit: Payer: Self-pay

## 2019-09-18 ENCOUNTER — Encounter: Payer: Self-pay | Admitting: Sports Medicine

## 2019-09-18 DIAGNOSIS — B351 Tinea unguium: Secondary | ICD-10-CM | POA: Diagnosis not present

## 2019-09-18 DIAGNOSIS — M79676 Pain in unspecified toe(s): Secondary | ICD-10-CM

## 2019-09-18 NOTE — Progress Notes (Signed)
Patient ID: Susan Giles, female   DOB: [birth year], 54 y.o.   MRN: [id number]   Subjective: Susan Giles is a 54 y.o. female patient seen today in office with complaint of painful thickened and elongated toenails; unable to trim.  Patient reports that her left big toenail is sore and has been soaking with warm water that helps.  Patient is from [facility] group home and is assisted by caregiver this visit.   Patient Active Problem List   Diagnosis Date Noted  . Alcohol dependence (HCC) 07/05/2014  . Uncomplicated alcohol dependence (HCC)     Current Outpatient Medications on File Prior to Visit  Medication Sig Dispense Refill  . lurasidone (LATUDA) 20 MG TABS tablet take 1 tablet by oral route  every evening with food (at least 350 calories)    . aspirin EC 81 MG tablet Take 1 tablet (81 mg total) by mouth daily.    Marland Kitchen atorvastatin (LIPITOR) 40 MG tablet     . ciclopirox (PENLAC) 8 % solution Apply topically at bedtime. Apply over nail and surrounding skin. Apply daily over previous coat for nail fungus and file on day 7 and then repeat 6.6 mL 0  . clotrimazole (LOTRIMIN) 1 % external solution Apply 1 application topically 2 (two) times daily. In between toes 30 mL 0  . divalproex (DEPAKOTE) 250 MG DR tablet Take 2 tablets (500 mg total) by mouth 2 (two) times daily. 120 tablet 0  . docusate sodium (COLACE) 100 MG capsule Take 1 capsule (100 mg total) by mouth daily. 10 capsule 0  . Doxepin HCl 6 MG TABS Take 1 tablet by mouth at bedtime.    . fenofibrate (TRICOR) 145 MG tablet Take 1 tablet (145 mg total) by mouth daily.    Marland Kitchen FLUoxetine (PROZAC) 20 MG capsule Take 1 capsule (20 mg total) by mouth daily. 30 capsule 0  . hydrOXYzine (ATARAX/VISTARIL) 25 MG tablet Take 1 tablet (25 mg total) by mouth every 6 (six) hours as needed (anxiety). 21 tablet 0  . lidocaine (XYLOCAINE) 5 % ointment Apply 1 application topically as needed. For toe pain 35.44 g 0  . LINZESS 290 MCG CAPS capsule  Take 290 mcg by mouth daily.    Marland Kitchen loratadine (CLARITIN) 10 MG tablet Take 1 tablet (10 mg total) by mouth daily.    Marland Kitchen MYRBETRIQ 25 MG TB24 tablet TAKE ONE TABLET BY MOUTH ONCE DAILY. (OVAL BROWN TABLET WITH 325)    . rosuvastatin (CRESTOR) 5 MG tablet Take 1 tablet (5 mg total) by mouth daily.    . traZODone (DESYREL) 50 MG tablet Take 1 tablet (50 mg total) by mouth at bedtime as needed for sleep. 14 tablet 0  . valACYclovir (VALTREX) 500 MG tablet Take 1 tablet (500 mg total) by mouth daily.    . Vitamin D, Ergocalciferol, (DRISDOL) 1.25 MG (50000 UNIT) CAPS capsule Take 50,000 Units by mouth once a week.     No current facility-administered medications on file prior to visit.    Allergies  Allergen Reactions  . Penicillin G   . Penicillins Other (See Comments)    Unknown     Objective: Physical Exam  General: Well developed, nourished, no acute distress, awake, alert and oriented  Vascular: Dorsalis pedis artery 2/4 bilateral, Posterior tibial artery 2/4 bilateral, skin temperature warm to warm proximal to distal bilateral lower extremities, no varicosities, pedal hair present bilateral.  Neurological: Gross sensation present via light touch bilateral.   Dermatological: Skin is warm,  dry, and supple bilateral, Nails 1-10 are tender, long, thick, and discolored with mild subungal debris, no acute ingrowing, resolved webspace maceration with superimposed dry scaly skin present bilateral resembling tinea that is much improved, no open lesions present bilateral, no callus/corns/hyperkeratotic tissue present bilateral. No signs of infection bilateral.  Musculoskeletal: Hammertoe deformities noted bilateral. Muscular strength within normal limits without pain on range of motion. No pain with calf compression bilateral.  Assessment and Plan:  Problem List Items Addressed This Visit    None    Visit Diagnoses    Pain due to onychomycosis of nail    -  Primary      -Examined  patient.  -Discussed treatment options for painful mycotic nails  -Mechanically debrided and reduced mycotic nails with sterile nail nipper and dremel nail file without incident to patient's tolerance -Continue with Penlac solution to use as instructed -Patient to return in 3 months/as needed for follow up evaluation or sooner if symptoms worsen.  Landis Martins, DPM

## 2019-09-27 ENCOUNTER — Ambulatory Visit: Payer: Medicaid Other | Admitting: Sports Medicine

## 2019-12-19 ENCOUNTER — Ambulatory Visit (INDEPENDENT_AMBULATORY_CARE_PROVIDER_SITE_OTHER): Payer: Medicaid Other | Admitting: Sports Medicine

## 2019-12-19 ENCOUNTER — Other Ambulatory Visit: Payer: Self-pay

## 2019-12-19 ENCOUNTER — Encounter: Payer: Self-pay | Admitting: Sports Medicine

## 2019-12-19 DIAGNOSIS — M79676 Pain in unspecified toe(s): Secondary | ICD-10-CM

## 2019-12-19 DIAGNOSIS — M79609 Pain in unspecified limb: Secondary | ICD-10-CM

## 2019-12-19 DIAGNOSIS — B351 Tinea unguium: Secondary | ICD-10-CM | POA: Diagnosis not present

## 2019-12-19 NOTE — Progress Notes (Signed)
Patient ID: Susan Giles, female   DOB: December 06, 1965, 54 y.o.   MRN: [id number]   Subjective: Susan Giles is a 53 y.o. female patient seen today in office with complaint of painful thickened and elongated toenails; unable to trim.   Patient is from [facility] group home and is assisted by caregiver this visit.   Patient Active Problem List   Diagnosis Date Noted  . Alcohol dependence (HCC) 07/05/2014  . Uncomplicated alcohol dependence (HCC)     Current Outpatient Medications on File Prior to Visit  Medication Sig Dispense Refill  . aspirin EC 81 MG tablet Take 1 tablet (81 mg total) by mouth daily.    Marland Kitchen atorvastatin (LIPITOR) 40 MG tablet     . ciclopirox (PENLAC) 8 % solution Apply topically at bedtime. Apply over nail and surrounding skin. Apply daily over previous coat for nail fungus and file on day 7 and then repeat 6.6 mL 0  . clotrimazole (LOTRIMIN) 1 % external solution Apply 1 application topically 2 (two) times daily. In between toes 30 mL 0  . divalproex (DEPAKOTE) 250 MG DR tablet Take 2 tablets (500 mg total) by mouth 2 (two) times daily. 120 tablet 0  . docusate sodium (COLACE) 100 MG capsule Take 1 capsule (100 mg total) by mouth daily. 10 capsule 0  . Doxepin HCl 6 MG TABS Take 1 tablet by mouth at bedtime.    . fenofibrate (TRICOR) 145 MG tablet Take 1 tablet (145 mg total) by mouth daily.    Marland Kitchen FLUoxetine (PROZAC) 20 MG capsule Take 1 capsule (20 mg total) by mouth daily. 30 capsule 0  . hydrOXYzine (ATARAX/VISTARIL) 25 MG tablet Take 1 tablet (25 mg total) by mouth every 6 (six) hours as needed (anxiety). 21 tablet 0  . lidocaine (XYLOCAINE) 5 % ointment Apply 1 application topically as needed. For toe pain 35.44 g 0  . LINZESS 290 MCG CAPS capsule Take 290 mcg by mouth daily.    Marland Kitchen loratadine (CLARITIN) 10 MG tablet Take 1 tablet (10 mg total) by mouth daily.    Marland Kitchen lurasidone (LATUDA) 20 MG TABS tablet take 1 tablet by oral route  every evening with food (at least  350 calories)    . MYRBETRIQ 25 MG TB24 tablet TAKE ONE TABLET BY MOUTH ONCE DAILY. (OVAL BROWN TABLET WITH 325)    . rosuvastatin (CRESTOR) 5 MG tablet Take 1 tablet (5 mg total) by mouth daily.    . traZODone (DESYREL) 50 MG tablet Take 1 tablet (50 mg total) by mouth at bedtime as needed for sleep. 14 tablet 0  . valACYclovir (VALTREX) 500 MG tablet Take 1 tablet (500 mg total) by mouth daily.    . Vitamin D, Ergocalciferol, (DRISDOL) 1.25 MG (50000 UNIT) CAPS capsule Take 50,000 Units by mouth once a week.     No current facility-administered medications on file prior to visit.    Allergies  Allergen Reactions  . Penicillin G   . Penicillins Other (See Comments)    Unknown     Objective: Physical Exam  General: Well developed, nourished, no acute distress, awake, alert and oriented  Vascular: Dorsalis pedis artery 2/4 bilateral, Posterior tibial artery 2/4 bilateral, skin temperature warm to warm proximal to distal bilateral lower extremities, no varicosities, pedal hair present bilateral.  Neurological: Gross sensation present via light touch bilateral.   Dermatological: Skin is warm, dry, and supple bilateral, Nails 1-10 are tender, long, thick, and discolored with mild subungal debris, no acute  ingrowing, resolved webspace maceration with superimposed dry scaly skin present bilateral resembling tinea that is much improved, no open lesions present bilateral, no callus/corns/hyperkeratotic tissue present bilateral. No signs of infection bilateral.  Musculoskeletal: Hammertoe deformities noted bilateral. Muscular strength within normal limits without pain on range of motion. No pain with calf compression bilateral.  Assessment and Plan:  Problem List Items Addressed This Visit    None    Visit Diagnoses    Pain due to onychomycosis of nail    -  Primary      -Examined patient.  -Discussed treatment options for painful mycotic nails  -Mechanically debrided and reduced  mycotic nails with sterile nail nipper and dremel nail file without incident to patient's tolerance -Recommend Vicks VapoRub to nails as instructed since they have been unable to get the Penlac solution from the drugstore -Patient to return in 3 months/as needed for follow up evaluation or sooner if symptoms worsen.  Landis Martins, DPM

## 2020-03-19 ENCOUNTER — Ambulatory Visit (INDEPENDENT_AMBULATORY_CARE_PROVIDER_SITE_OTHER): Payer: Medicaid Other | Admitting: Sports Medicine

## 2020-03-19 ENCOUNTER — Other Ambulatory Visit: Payer: Self-pay

## 2020-03-19 ENCOUNTER — Encounter: Payer: Self-pay | Admitting: Sports Medicine

## 2020-03-19 DIAGNOSIS — B351 Tinea unguium: Secondary | ICD-10-CM | POA: Diagnosis not present

## 2020-03-19 DIAGNOSIS — M79609 Pain in unspecified limb: Secondary | ICD-10-CM

## 2020-03-19 DIAGNOSIS — M79676 Pain in unspecified toe(s): Secondary | ICD-10-CM | POA: Diagnosis not present

## 2020-03-19 NOTE — Progress Notes (Signed)
Patient ID: Susan Giles, female   DOB: 02-17-1966, 54 y.o.   MRN: [id number]   Subjective: Susan Giles is a 54 y.o. female patient seen today in office with complaint of painful thickened and elongated toenails; unable to trim.   Patient is from [facility] group home and is assisted by caregiver this visit.   Patient Active Problem List   Diagnosis Date Noted  . Alcohol dependence (HCC) 07/05/2014  . Uncomplicated alcohol dependence (HCC)     Current Outpatient Medications on File Prior to Visit  Medication Sig Dispense Refill  . aspirin EC 81 MG tablet Take 1 tablet (81 mg total) by mouth daily.    Marland Kitchen atorvastatin (LIPITOR) 40 MG tablet     . ciclopirox (PENLAC) 8 % solution Apply topically at bedtime. Apply over nail and surrounding skin. Apply daily over previous coat for nail fungus and file on day 7 and then repeat 6.6 mL 0  . clotrimazole (LOTRIMIN) 1 % external solution Apply 1 application topically 2 (two) times daily. In between toes 30 mL 0  . divalproex (DEPAKOTE) 250 MG DR tablet Take 2 tablets (500 mg total) by mouth 2 (two) times daily. 120 tablet 0  . docusate sodium (COLACE) 100 MG capsule Take 1 capsule (100 mg total) by mouth daily. 10 capsule 0  . Doxepin HCl 6 MG TABS Take 1 tablet by mouth at bedtime.    . fenofibrate (TRICOR) 145 MG tablet Take 1 tablet (145 mg total) by mouth daily.    Marland Kitchen FLUoxetine (PROZAC) 20 MG capsule Take 1 capsule (20 mg total) by mouth daily. 30 capsule 0  . hydrOXYzine (ATARAX/VISTARIL) 25 MG tablet Take 1 tablet (25 mg total) by mouth every 6 (six) hours as needed (anxiety). 21 tablet 0  . lidocaine (XYLOCAINE) 5 % ointment Apply 1 application topically as needed. For toe pain 35.44 g 0  . LINZESS 290 MCG CAPS capsule Take 290 mcg by mouth daily.    Marland Kitchen loratadine (CLARITIN) 10 MG tablet Take 1 tablet (10 mg total) by mouth daily.    Marland Kitchen lurasidone (LATUDA) 20 MG TABS tablet take 1 tablet by oral route  every evening with food (at least  350 calories)    . MYRBETRIQ 25 MG TB24 tablet TAKE ONE TABLET BY MOUTH ONCE DAILY. (OVAL BROWN TABLET WITH 325)    . rosuvastatin (CRESTOR) 5 MG tablet Take 1 tablet (5 mg total) by mouth daily.    . traZODone (DESYREL) 50 MG tablet Take 1 tablet (50 mg total) by mouth at bedtime as needed for sleep. 14 tablet 0  . valACYclovir (VALTREX) 500 MG tablet Take 1 tablet (500 mg total) by mouth daily.    . Vitamin D, Ergocalciferol, (DRISDOL) 1.25 MG (50000 UNIT) CAPS capsule Take 50,000 Units by mouth once a week.     No current facility-administered medications on file prior to visit.    Allergies  Allergen Reactions  . Penicillin G   . Penicillins Other (See Comments)    Unknown     Objective: Physical Exam  General: Well developed, nourished, no acute distress, awake, alert and oriented  Vascular: Dorsalis pedis artery 2/4 bilateral, Posterior tibial artery 2/4 bilateral, skin temperature warm to warm proximal to distal bilateral lower extremities, no varicosities, pedal hair present bilateral.  Neurological: Gross sensation present via light touch bilateral.   Dermatological: Skin is warm, dry, and supple bilateral, Nails 1-10 are tender, long, thick, and discolored with mild subungal debris, no acute  ingrowing, no open lesions present bilateral, no callus/corns/hyperkeratotic tissue present bilateral. No signs of infection bilateral.  Musculoskeletal: Hammertoe deformities noted bilateral. Muscular strength within normal limits without pain on range of motion. No pain with calf compression bilateral.  Assessment and Plan:  Problem List Items Addressed This Visit    None    Visit Diagnoses    Pain due to onychomycosis of nail    -  Primary      -Examined patient.  -Re-Discussed treatment options for painful mycotic nails  -Mechanically debrided and reduced mycotic nails with sterile nail nipper and dremel nail file without incident to patient's tolerance -Facility paperwork  completed for home and advised caregiver that if her toes are sore may soak with Epson salt if needed -Patient to return in 3 months/as needed for follow up evaluation or sooner if symptoms worsen.  [name], DPM

## 2020-06-19 ENCOUNTER — Ambulatory Visit: Payer: Medicaid Other | Admitting: Sports Medicine

## 2020-06-19 ENCOUNTER — Encounter: Payer: Self-pay | Admitting: Sports Medicine

## 2020-06-19 ENCOUNTER — Other Ambulatory Visit: Payer: Self-pay

## 2020-06-19 DIAGNOSIS — M79609 Pain in unspecified limb: Secondary | ICD-10-CM | POA: Diagnosis not present

## 2020-06-19 DIAGNOSIS — B351 Tinea unguium: Secondary | ICD-10-CM | POA: Diagnosis not present

## 2020-06-19 DIAGNOSIS — M79676 Pain in unspecified toe(s): Secondary | ICD-10-CM

## 2020-06-19 NOTE — Progress Notes (Signed)
Patient ID: Susan Giles, female   DOB: Nov 27, 1965, 54 y.o.   MRN: [id number]   Subjective: Susan Giles is a 54 y.o. female patient seen today in office with complaint of painful thickened and elongated toenails; unable to trim.   Patient is from [facility] group home and is assisted by caregiver this visit.   Patient Active Problem List   Diagnosis Date Noted  . Alcohol dependence (HCC) 07/05/2014  . Uncomplicated alcohol dependence (HCC)     Current Outpatient Medications on File Prior to Visit  Medication Sig Dispense Refill  . aspirin EC 81 MG tablet Take 1 tablet (81 mg total) by mouth daily.    Marland Kitchen atorvastatin (LIPITOR) 40 MG tablet     . ciclopirox (PENLAC) 8 % solution Apply topically at bedtime. Apply over nail and surrounding skin. Apply daily over previous coat for nail fungus and file on day 7 and then repeat 6.6 mL 0  . clotrimazole (LOTRIMIN) 1 % external solution Apply 1 application topically 2 (two) times daily. In between toes 30 mL 0  . divalproex (DEPAKOTE) 250 MG DR tablet Take 2 tablets (500 mg total) by mouth 2 (two) times daily. 120 tablet 0  . docusate sodium (COLACE) 100 MG capsule Take 1 capsule (100 mg total) by mouth daily. 10 capsule 0  . Doxepin HCl 6 MG TABS Take 1 tablet by mouth at bedtime.    . fenofibrate (TRICOR) 145 MG tablet Take 1 tablet (145 mg total) by mouth daily.    Marland Kitchen FLUoxetine (PROZAC) 20 MG capsule Take 1 capsule (20 mg total) by mouth daily. 30 capsule 0  . hydrOXYzine (ATARAX/VISTARIL) 25 MG tablet Take 1 tablet (25 mg total) by mouth every 6 (six) hours as needed (anxiety). 21 tablet 0  . lidocaine (XYLOCAINE) 5 % ointment Apply 1 application topically as needed. For toe pain 35.44 g 0  . LINZESS 290 MCG CAPS capsule Take 290 mcg by mouth daily.    Marland Kitchen loratadine (CLARITIN) 10 MG tablet Take 1 tablet (10 mg total) by mouth daily.    Marland Kitchen lurasidone (LATUDA) 20 MG TABS tablet take 1 tablet by oral route  every evening with food (at least  350 calories)    . MYRBETRIQ 25 MG TB24 tablet TAKE ONE TABLET BY MOUTH ONCE DAILY. (OVAL BROWN TABLET WITH 325)    . rosuvastatin (CRESTOR) 5 MG tablet Take 1 tablet (5 mg total) by mouth daily.    . traZODone (DESYREL) 50 MG tablet Take 1 tablet (50 mg total) by mouth at bedtime as needed for sleep. 14 tablet 0  . valACYclovir (VALTREX) 500 MG tablet Take 1 tablet (500 mg total) by mouth daily.    . Vitamin D, Ergocalciferol, (DRISDOL) 1.25 MG (50000 UNIT) CAPS capsule Take 50,000 Units by mouth once a week.     No current facility-administered medications on file prior to visit.    Allergies  Allergen Reactions  . Penicillin G   . Penicillins Other (See Comments)    Unknown     Objective: Physical Exam  General: Well developed, nourished, no acute distress, awake, alert and oriented  Vascular: Dorsalis pedis artery 2/4 bilateral, Posterior tibial artery 2/4 bilateral, skin temperature warm to warm proximal to distal bilateral lower extremities, no varicosities, pedal hair present bilateral.  Neurological: Gross sensation present via light touch bilateral.   Dermatological: Skin is warm, dry, and supple bilateral, Nails 1-10 are tender, long, thick, and discolored with mild subungal debris, no acute  ingrowing, no open lesions present bilateral, no callus/corns/hyperkeratotic tissue present bilateral. No signs of infection bilateral.  Musculoskeletal: Hammertoe deformities noted bilateral. Muscular strength within normal limits without pain on range of motion. No pain with calf compression bilateral.  Assessment and Plan:  Problem List Items Addressed This Visit    None    Visit Diagnoses    Pain due to onychomycosis of nail    -  Primary      -Examined patient.  -Re-Discussed treatment options for painful mycotic nails  -Mechanically debrided and reduced mycotic nails with sterile nail nipper without incident  -Patient to return in 3 months/as needed for follow up  evaluation or sooner if symptoms worsen.  Susan Giles, DPM

## 2020-07-29 DIAGNOSIS — Z01818 Encounter for other preprocedural examination: Secondary | ICD-10-CM | POA: Diagnosis not present

## 2020-09-22 ENCOUNTER — Ambulatory Visit: Payer: Medicaid Other | Admitting: Sports Medicine

## 2020-10-07 ENCOUNTER — Ambulatory Visit (INDEPENDENT_AMBULATORY_CARE_PROVIDER_SITE_OTHER): Payer: Medicaid Other | Admitting: Sports Medicine

## 2020-10-07 ENCOUNTER — Encounter: Payer: Self-pay | Admitting: Sports Medicine

## 2020-10-07 ENCOUNTER — Other Ambulatory Visit: Payer: Self-pay

## 2020-10-07 DIAGNOSIS — B351 Tinea unguium: Secondary | ICD-10-CM | POA: Diagnosis not present

## 2020-10-07 DIAGNOSIS — M79609 Pain in unspecified limb: Secondary | ICD-10-CM | POA: Diagnosis not present

## 2020-10-07 DIAGNOSIS — M79675 Pain in left toe(s): Secondary | ICD-10-CM

## 2020-10-07 DIAGNOSIS — M79674 Pain in right toe(s): Secondary | ICD-10-CM | POA: Diagnosis not present

## 2020-10-07 DIAGNOSIS — M79676 Pain in unspecified toe(s): Secondary | ICD-10-CM

## 2020-10-07 NOTE — Progress Notes (Signed)
Patient ID: Susan Giles, female   DOB: Aug 19, 1966, 55 y.o.   MRN: [id number]   Subjective: Susan Giles is a 55 y.o. female patient seen today in office with complaint of painful thickened and elongated toenails; unable to trim.  Patient reports that she recently had surgery on her hip and is recovering.  Patient is from [facility] group home and is assisted by caregiver this visit.   Patient Active Problem List   Diagnosis Date Noted  . Alcohol dependence (HCC) 07/05/2014  . Uncomplicated alcohol dependence (HCC)     Current Outpatient Medications on File Prior to Visit  Medication Sig Dispense Refill  . aspirin EC 81 MG tablet Take 1 tablet (81 mg total) by mouth daily.    Marland Kitchen atorvastatin (LIPITOR) 40 MG tablet     . busPIRone (BUSPAR) 10 MG tablet TAKE ONE TABLET BY MOUTH TWO TIMES A DAY ( RECTANGLE TABLET WITH 10 OR OBLONG WITH 066)    . celecoxib (CELEBREX) 200 MG capsule TAKE 1 CAPSULE BY MOUTH 2 TIMES DAILY.(WHITE CAP WITH GREEN STRIPS)    . ciclopirox (PENLAC) 8 % solution Apply topically at bedtime. Apply over nail and surrounding skin. Apply daily over previous coat for nail fungus and file on day 7 and then repeat 6.6 mL 0  . clotrimazole (LOTRIMIN) 1 % external solution Apply 1 application topically 2 (two) times daily. In between toes 30 mL 0  . divalproex (DEPAKOTE) 250 MG DR tablet Take 2 tablets (500 mg total) by mouth 2 (two) times daily. 120 tablet 0  . docusate sodium (COLACE) 100 MG capsule Take 1 capsule (100 mg total) by mouth daily. 10 capsule 0  . Doxepin HCl 6 MG TABS Take 1 tablet by mouth at bedtime.    . fenofibrate (TRICOR) 145 MG tablet Take 1 tablet (145 mg total) by mouth daily.    Marland Kitchen FLUoxetine (PROZAC) 20 MG capsule Take 1 capsule (20 mg total) by mouth daily. 30 capsule 0  . hydrOXYzine (ATARAX/VISTARIL) 25 MG tablet Take 1 tablet (25 mg total) by mouth every 6 (six) hours as needed (anxiety). 21 tablet 0  . lidocaine (XYLOCAINE) 5 % ointment Apply 1  application topically as needed. For toe pain 35.44 g 0  . LINZESS 290 MCG CAPS capsule Take 290 mcg by mouth daily.    Marland Kitchen loratadine (CLARITIN) 10 MG tablet Take 1 tablet (10 mg total) by mouth daily.    Marland Kitchen lurasidone (LATUDA) 20 MG TABS tablet take 1 tablet by oral route  every evening with food (at least 350 calories)    . mirtazapine (REMERON) 15 MG tablet Take 15 mg by mouth at bedtime.    Marland Kitchen MYRBETRIQ 25 MG TB24 tablet TAKE ONE TABLET BY MOUTH ONCE DAILY. (OVAL BROWN TABLET WITH 325)    . oxyCODONE (OXY IR/ROXICODONE) 5 MG immediate release tablet Take 5 mg by mouth every 4 (four) hours as needed.    . predniSONE (DELTASONE) 5 MG tablet Take by mouth daily.    . rosuvastatin (CRESTOR) 5 MG tablet Take 1 tablet (5 mg total) by mouth daily.    . traZODone (DESYREL) 50 MG tablet Take 1 tablet (50 mg total) by mouth at bedtime as needed for sleep. 14 tablet 0  . valACYclovir (VALTREX) 500 MG tablet Take 1 tablet (500 mg total) by mouth daily.    . Vitamin D, Ergocalciferol, (DRISDOL) 1.25 MG (50000 UNIT) CAPS capsule Take 50,000 Units by mouth once a week.  No current facility-administered medications on file prior to visit.    Allergies  Allergen Reactions  . Penicillin G   . Penicillins Other (See Comments)    Unknown     Objective: Physical Exam  General: Well developed, nourished, no acute distress, awake, alert and oriented  Vascular: Dorsalis pedis artery 2/4 bilateral, Posterior tibial artery 2/4 bilateral, skin temperature warm to warm proximal to distal bilateral lower extremities, no varicosities, pedal hair present bilateral.  Neurological: Gross sensation present via light touch bilateral.   Dermatological: Skin is warm, dry, and supple bilateral, Nails 1-10 are tender, long, thick, and discolored with mild subungal debris, no acute ingrowing, no open lesions present bilateral, no callus/corns/hyperkeratotic tissue present bilateral. No signs of infection  bilateral.  Musculoskeletal: Hammertoe deformities noted bilateral. Muscular strength within normal limits without pain on range of motion. No pain with calf compression bilateral.  Assessment and Plan:  Problem List Items Addressed This Visit   None   Visit Diagnoses    Pain due to onychomycosis of nail    -  Primary      -Examined patient.  -Re-Discussed treatment options for painful mycotic nails  -Mechanically debrided and reduced mycotic nails with sterile nail nipper without incident  -PATIENT DOES NOT LIKE DREMEL/NAIL SMOOTH -Patient to return in 3 months/as needed for follow up evaluation or sooner if symptoms worsen.  [name], DPM

## 2020-11-18 ENCOUNTER — Ambulatory Visit: Payer: Medicaid Other | Admitting: Sports Medicine

## 2020-11-18 ENCOUNTER — Encounter: Payer: Self-pay | Admitting: Sports Medicine

## 2020-11-18 ENCOUNTER — Other Ambulatory Visit: Payer: Self-pay

## 2020-11-18 DIAGNOSIS — M79671 Pain in right foot: Secondary | ICD-10-CM

## 2020-11-18 DIAGNOSIS — M79672 Pain in left foot: Secondary | ICD-10-CM | POA: Diagnosis not present

## 2020-11-18 DIAGNOSIS — B359 Dermatophytosis, unspecified: Secondary | ICD-10-CM

## 2020-11-18 MED ORDER — TERBINAFINE HCL 1 % EX CREA: 1.0000 "application " | TOPICAL_CREAM | Freq: Two times a day (BID) | CUTANEOUS | 0 refills | Status: AC

## 2020-11-18 NOTE — Progress Notes (Signed)
Subjective: Susan Giles is a 55 y.o. female patient who presents to office for evaluation R>L heel pain; burning and itching to the skin at the heel. Patient wants nail trim. No other issues. Assisted by facility aid. No other issue noted.   Patient Active Problem List   Diagnosis Date Noted  . Alcohol dependence (HCC) 07/05/2014  . Uncomplicated alcohol dependence (HCC)     Current Outpatient Medications on File Prior to Visit  Medication Sig Dispense Refill  . aspirin EC 81 MG tablet Take 1 tablet (81 mg total) by mouth daily.    Marland Kitchen atorvastatin (LIPITOR) 40 MG tablet     . busPIRone (BUSPAR) 10 MG tablet TAKE ONE TABLET BY MOUTH TWO TIMES A DAY ( RECTANGLE TABLET WITH 10 OR OBLONG WITH 066)    . celecoxib (CELEBREX) 200 MG capsule TAKE 1 CAPSULE BY MOUTH 2 TIMES DAILY.(WHITE CAP WITH GREEN STRIPS)    . ciclopirox (PENLAC) 8 % solution Apply topically at bedtime. Apply over nail and surrounding skin. Apply daily over previous coat for nail fungus and file on day 7 and then repeat 6.6 mL 0  . clotrimazole (LOTRIMIN) 1 % external solution Apply 1 application topically 2 (two) times daily. In between toes 30 mL 0  . divalproex (DEPAKOTE) 250 MG DR tablet Take 2 tablets (500 mg total) by mouth 2 (two) times daily. 120 tablet 0  . docusate sodium (COLACE) 100 MG capsule Take 1 capsule (100 mg total) by mouth daily. 10 capsule 0  . Doxepin HCl 6 MG TABS Take 1 tablet by mouth at bedtime.    . fenofibrate (TRICOR) 145 MG tablet Take 1 tablet (145 mg total) by mouth daily.    Marland Kitchen FLUoxetine (PROZAC) 20 MG capsule Take 1 capsule (20 mg total) by mouth daily. 30 capsule 0  . hydrOXYzine (ATARAX/VISTARIL) 25 MG tablet Take 1 tablet (25 mg total) by mouth every 6 (six) hours as needed (anxiety). 21 tablet 0  . lidocaine (XYLOCAINE) 5 % ointment Apply 1 application topically as needed. For toe pain 35.44 g 0  . LINZESS 290 MCG CAPS capsule Take 290 mcg by mouth daily.    Marland Kitchen loratadine (CLARITIN) 10  MG tablet Take 1 tablet (10 mg total) by mouth daily.    Marland Kitchen lurasidone (LATUDA) 20 MG TABS tablet take 1 tablet by oral route  every evening with food (at least 350 calories)    . mirtazapine (REMERON) 15 MG tablet Take 15 mg by mouth at bedtime.    Marland Kitchen MYRBETRIQ 25 MG TB24 tablet TAKE ONE TABLET BY MOUTH ONCE DAILY. (OVAL BROWN TABLET WITH 325)    . oxyCODONE (OXY IR/ROXICODONE) 5 MG immediate release tablet Take 5 mg by mouth every 4 (four) hours as needed.    . predniSONE (DELTASONE) 5 MG tablet Take by mouth daily.    . rosuvastatin (CRESTOR) 5 MG tablet Take 1 tablet (5 mg total) by mouth daily.    . traZODone (DESYREL) 50 MG tablet Take 1 tablet (50 mg total) by mouth at bedtime as needed for sleep. 14 tablet 0  . valACYclovir (VALTREX) 500 MG tablet Take 1 tablet (500 mg total) by mouth daily.    . Vitamin D, Ergocalciferol, (DRISDOL) 1.25 MG (50000 UNIT) CAPS capsule Take 50,000 Units by mouth once a week.     No current facility-administered medications on file prior to visit.    Allergies  Allergen Reactions  . Penicillin G   . Penicillins Other (See Comments)  Unknown     Objective:  General: Alert and oriented x2  Vascular: Dorsalis pedis artery 2/4 bilateral, Posterior tibial artery 2/4 bilateral, skin temperature warm to warm proximal to distal bilateral lower extremities, no varicosities, pedal hair present bilateral.  Neurological: Gross sensation present via light touch bilateral.   Dermatological: Skin is warm, dry, and supple bilateral, Nails 1-10 are mildly elongated thick, and discolored with mild subungal debris, no acute ingrowing, no open lesions present bilateral, dry scaly skin to the bottoms of both feet likely consistent with tinea.  No signs of infection bilateral.  Musculoskeletal: Hammertoe deformities noted bilateral. Muscular strength within normal limits without pain on range of motion. No pain with calf compression bilateral.  Assessment and  Plan: Problem List Items Addressed This Visit   None   Visit Diagnoses    Tinea    -  Primary   Relevant Medications   terbinafine (LAMISIL AT) 1 % cream   Foot pain, bilateral       R>L       -Complete examination performed -Discussed treatment options -Rx Lamisil for tinea -Applied heel cushions tissues bilateral -Advised patient that her pain could be coming from her gait secondary to her hip surgery versus fungus -Patient to return to office as scheduled for tinea follow-up and for nail trim or sooner if condition worsens.  [name], DPM

## 2020-12-02 ENCOUNTER — Ambulatory Visit (INDEPENDENT_AMBULATORY_CARE_PROVIDER_SITE_OTHER): Payer: Medicaid Other | Admitting: Gastroenterology

## 2020-12-02 ENCOUNTER — Other Ambulatory Visit: Payer: Self-pay

## 2020-12-02 ENCOUNTER — Encounter: Payer: Self-pay | Admitting: Gastroenterology

## 2020-12-02 VITALS — BP 118/64 | HR 101 | Ht 61.0 in | Wt 123.0 lb

## 2020-12-02 DIAGNOSIS — R634 Abnormal weight loss: Secondary | ICD-10-CM | POA: Diagnosis not present

## 2020-12-02 DIAGNOSIS — K625 Hemorrhage of anus and rectum: Secondary | ICD-10-CM | POA: Diagnosis not present

## 2020-12-02 DIAGNOSIS — R1030 Lower abdominal pain, unspecified: Secondary | ICD-10-CM | POA: Diagnosis not present

## 2020-12-02 MED ORDER — FLUTICASONE PROPIONATE 0.05 % EX CREA: TOPICAL_CREAM | Freq: Two times a day (BID) | CUTANEOUS | 2 refills | Status: AC

## 2020-12-02 NOTE — Patient Instructions (Addendum)
If you are age 57 or older, your body mass index should be between 23-30. Your Body mass index is 23.24 kg/m. If this is out of the aforementioned range listed, please consider follow up with your Primary Care Provider.  If you are age 18 or younger, your body mass index should be between 19-25. Your Body mass index is 23.24 kg/m. If this is out of the aformentioned range listed, please consider follow up with your Primary Care Provider.   Please have your CBC with Diff and CMP done at Dr [facility] office and you have been given forms  Stop linzess  We have sent the following medications to your pharmacy for you to pick up at your convenience: Fluticasone  Call in 2 weeks to give [place] an update  Thank you,  Dr. Lynann Bologna

## 2020-12-02 NOTE — Progress Notes (Signed)
Chief Complaint: FU  Referring Provider:  Galvin Proffer, MD      ASSESSMENT AND PLAN;   #1. Rectal bleeding (resolved).  Likely d/t internal hemorrhoids. Neg colon 2018 #2. Lower abdo pain with associated diarrhea. H/O constipation #3. Weight loss #4. ? IDA  Plan: - CBC, CMP at Dr Stefanie Libel office. - Stop linzess - fluticasone cream 0.05% generic 30g 1 bid PR x 10 days, 2 refills - Call in 2 weeks. If still with diarhea, stop colace.    HPI:    Susan Giles is a 55 y.o. female  Seen at request of Dr. Ardelle Park  Per patient's caregiver, she lately has been having diarrhea with occasional rectal bleeding.  Mostly bright red in color.  No further rectal bleeding over the last 2 to 3 weeks.  She denies having any significant abdominal pain.  No fever chills or night sweats.  She has been on Linzess and Colace due to previous history of constipation.  She adamantly refuses to have another colonoscopy performed.  No nausea, vomiting, heartburn, regurgitation, odynophagia or dysphagia.  No fever chills or night sweats.  She has been given Ensure to maintain weight.  She does have previous history of cocaine and alcohol use.  But has not used since she has been in the facility for over last 3-4 years.  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 No pain meds  No further weight loss.  In fact she has gained weight as below.  Wt Readings from Last 3 Encounters:  12/02/20 123 lb (55.8 kg)  07/16/19 116 lb 6 oz (52.8 kg)  07/04/14 111 lb (50.3 kg)     Past GI procedures: -Colonoscopy 02/06/2017: ([facility])Highly redundant colon.  Quality of preparation was good.  Small internal hemorrhoids.  Otherwise normal to TI.  Past Medical History:  Diagnosis Date  . Allergic rhinitis   . Amenorrhea   . Anorexia   . BRBPR (bright red blood per rectum)   . Chronic constipation   . Depression   . GERD (gastroesophageal reflux disease)   . Hemorrhoids   . High cholesterol    . History of behavioral and mental health problems   . HSV-1 (herpes simplex virus 1) infection   . Hyperlipidemia   . Hypertension   . Hypertension   . Hypothyroidism   . Incontinence   . Migraine   . Schizophrenia (HCC)   . Substance abuse (HCC)    alcohol, crack, cocaine  . Suicidal thoughts   . Vitamin B12 deficiency     Past Surgical History:  Procedure Laterality Date  . COLONOSCOPY  02/06/2017   Small internal hemorrhoids (likely etiology of rectal bleeding-no active bleeding) Otherwise, normal colonoscopy to terminal ileum  . TOTAL ABDOMINAL HYSTERECTOMY    . TUBAL LIGATION    . WRIST SURGERY Right    To remove cyst    Family History  Problem Relation Age of Onset  . Other Mother        Has bleeding disorder  . Colon cancer Neg Hx   . Esophageal cancer Neg Hx     Social History   Tobacco Use  . Smoking status: Former Smoker    Types: Cigarettes  . Smokeless tobacco: Never Used  Vaping Use  . Vaping Use: Never used  Substance Use Topics  . Alcohol use: Not Currently    Comment: heavy  . Drug use: Not Currently    Types: Cocaine    Current Outpatient Medications  Medication Sig Dispense Refill  . aspirin EC 81 MG tablet Take 1 tablet (81 mg total) by mouth daily.    Marland Kitchen atorvastatin (LIPITOR) 40 MG tablet     . busPIRone (BUSPAR) 10 MG tablet TAKE ONE TABLET BY MOUTH TWO TIMES A DAY ( RECTANGLE TABLET WITH 10 OR OBLONG WITH 066)    . celecoxib (CELEBREX) 200 MG capsule TAKE 1 CAPSULE BY MOUTH 2 TIMES DAILY.(WHITE CAP WITH GREEN STRIPS)    . divalproex (DEPAKOTE) 250 MG DR tablet Take 2 tablets (500 mg total) by mouth 2 (two) times daily. 120 tablet 0  . docusate sodium (COLACE) 100 MG capsule Take 1 capsule (100 mg total) by mouth daily. 10 capsule 0  . Doxepin HCl 6 MG TABS Take 1 tablet by mouth at bedtime.    Marland Kitchen FLUoxetine (PROZAC) 20 MG capsule Take 1 capsule (20 mg total) by mouth daily. 30 capsule 0  . hydrOXYzine (ATARAX/VISTARIL) 25 MG tablet  Take 1 tablet (25 mg total) by mouth every 6 (six) hours as needed (anxiety). 21 tablet 0  . LINZESS 290 MCG CAPS capsule Take 290 mcg by mouth daily.    Marland Kitchen loratadine (CLARITIN) 10 MG tablet Take 1 tablet (10 mg total) by mouth daily.    Marland Kitchen lurasidone (LATUDA) 20 MG TABS tablet take 1 tablet by oral route  every evening with food (at least 350 calories)    . mirtazapine (REMERON) 15 MG tablet Take 15 mg by mouth at bedtime.    Marland Kitchen MYRBETRIQ 25 MG TB24 tablet TAKE ONE TABLET BY MOUTH ONCE DAILY. (OVAL BROWN TABLET WITH 325)    . predniSONE (DELTASONE) 5 MG tablet Take by mouth daily.    . rosuvastatin (CRESTOR) 5 MG tablet Take 1 tablet (5 mg total) by mouth daily.    Marland Kitchen terbinafine (LAMISIL AT) 1 % cream Apply 1 application topically 2 (two) times daily. To the bottoms of both feet 30 g 0  . traZODone (DESYREL) 50 MG tablet Take 1 tablet (50 mg total) by mouth at bedtime as needed for sleep. 14 tablet 0  . valACYclovir (VALTREX) 500 MG tablet Take 1 tablet (500 mg total) by mouth daily.    . Vitamin D, Ergocalciferol, (DRISDOL) 1.25 MG (50000 UNIT) CAPS capsule Take 50,000 Units by mouth once a week.    . fenofibrate (TRICOR) 145 MG tablet Take 1 tablet (145 mg total) by mouth daily.     No current facility-administered medications for this visit.    Allergies  Allergen Reactions  . Penicillin G   . Penicillins Other (See Comments)    Unknown     Review of Systems:  neg     Physical Exam:    Pulse (!) 102   Wt 123 lb (55.8 kg)   LMP  (LMP Unknown)   BMI 23.24 kg/m  Filed Weights   12/02/20 1546  Weight: 123 lb (55.8 kg)   Constitutional:  Well-developed, in no acute distress. Psychiatric: Normal mood and affect. Behavior is normal. HEENT: Pupils normal.  Conjunctivae are normal. No scleral icterus. Cardiovascular: Normal rate, regular rhythm. No edema Pulmonary/chest: Effort normal and breath sounds normal. No wheezing, rales or rhonchi. Abdominal: Soft, nondistended.  Nontender. Bowel sounds active throughout. There are no masses palpable. No hepatomegaly. Rectal: She wanted to hold off. Neurological: Alert and oriented to person place and time. Skin: Skin is warm and dry. No rashes noted.  Data Reviewed: I have personally reviewed following labs and imaging studies  CBC:  CBC Latest Ref Rng & Units 07/04/2014  WBC 4.0 - 10.5 K/uL 8.7  Hemoglobin 12.0 - 15.0 g/dL 40.3  Hematocrit 47.4 - 46.0 % 36.8  Platelets 150 - 400 K/uL 333    CMP: CMP Latest Ref Rng & Units 07/25/2019 07/04/2014  Glucose 70 - 99 mg/dL 80 87  BUN 6 - 23 mg/dL 16 13  Creatinine 2.59 - 1.20 mg/dL 5.63(O) 7.56  Sodium 433 - 145 mEq/L 142 141  Potassium 3.5 - 5.1 mEq/L 4.2 4.5  Chloride 96 - 112 mEq/L 106 102  CO2 19 - 32 mEq/L 27 24  Calcium 8.4 - 10.5 mg/dL 9.0 9.6  Total Protein 6.0 - 8.3 g/dL 6.7 7.7  Total Bilirubin 0.2 - 1.2 mg/dL 0.5 <2.9(J)  Alkaline Phos 39 - 117 U/L 61 37(L)  AST 0 - 37 U/L 37 30  ALT 0 - 35 U/L 14 14  25  minutes spent with the patient today. Greater than 50% was spent in counseling and coordination of care with the patient    , MD 12/02/2020, 3:50 PM  Cc: 12/04/2020, MD

## 2020-12-17 IMAGING — CT CT ABD-PELV W/ CM
2 of 5 series · 15 of 46 positions shown, 17 images · IV contrast (Omnipaque)
Comparison: None.

CLINICAL DATA: Lower abdominal pain and rectal bleeding.

EXAM:
CT ABDOMEN AND PELVIS WITH CONTRAST
TECHNIQUE: Multidetector CT imaging of the abdomen and pelvis was performed
using the standard protocol following bolus administration of
intravenous contrast.
CONTRAST:  100mL OMNIPAQUE IOHEXOL 300 MG/ML  SOLN

[Series 2: axial st · axial · 0.79mm/px · z∈[-492,-82]mm · 12 of 92 slices shown, 14 images]
[im 5/92  soft-tissue]
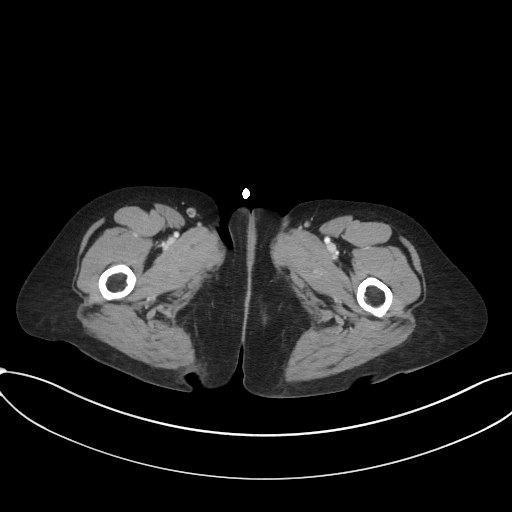
[im 5/92  bone]
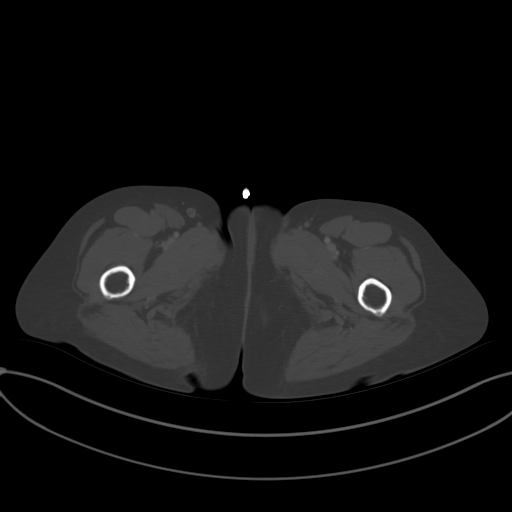
[im 15/92  soft-tissue]
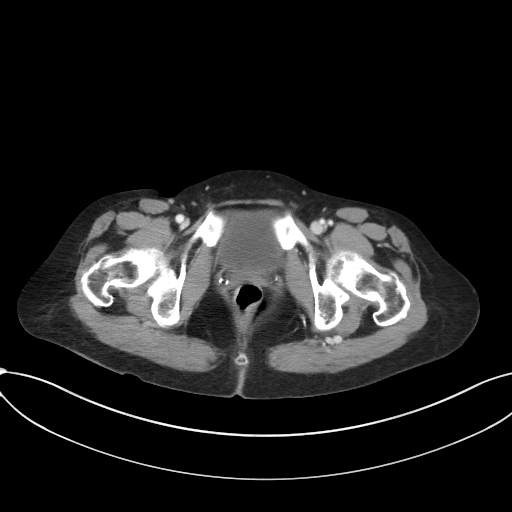
[im 20/92  soft-tissue]
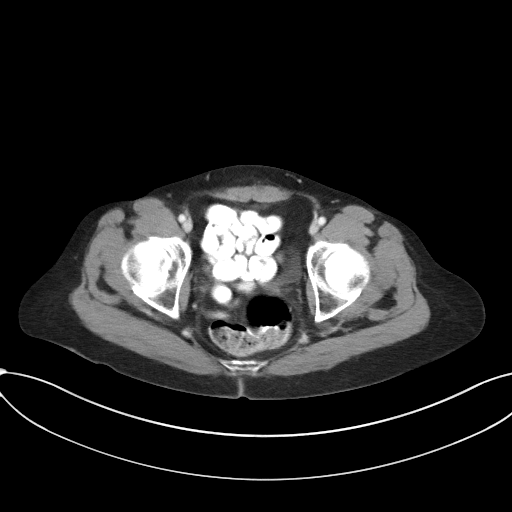
[im 29/92  soft-tissue]
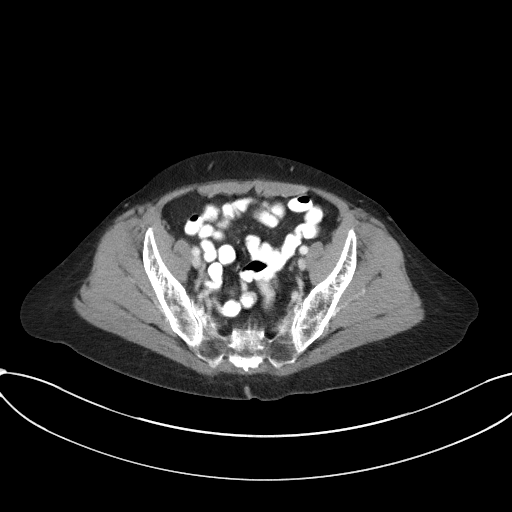
[im 34/92  soft-tissue]
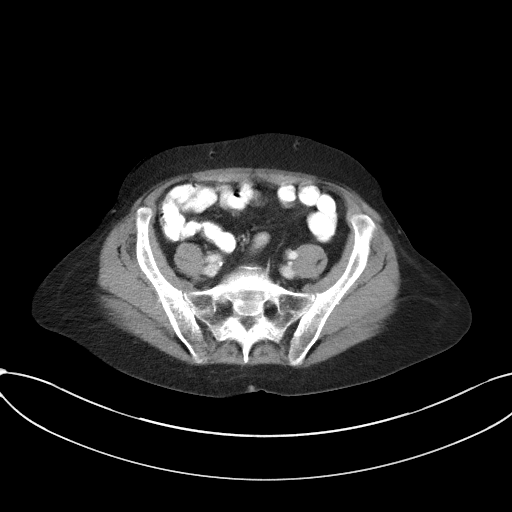
[im 44/92  soft-tissue]
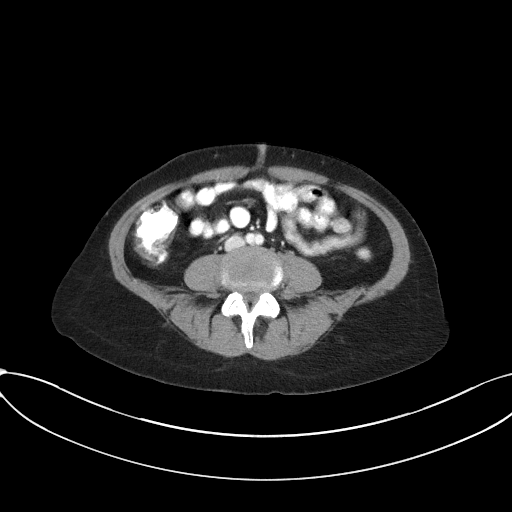
[im 48/92  soft-tissue]
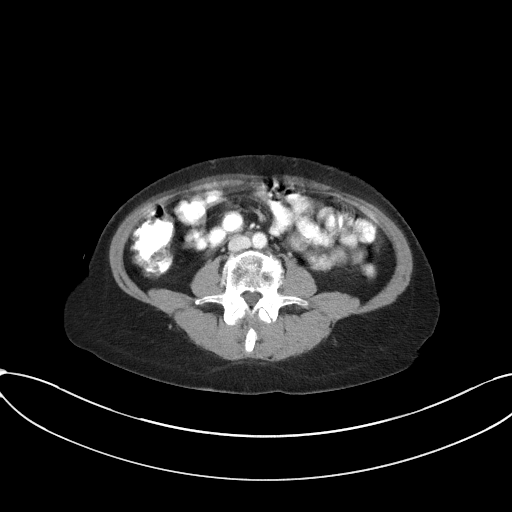
[im 58/92  soft-tissue]
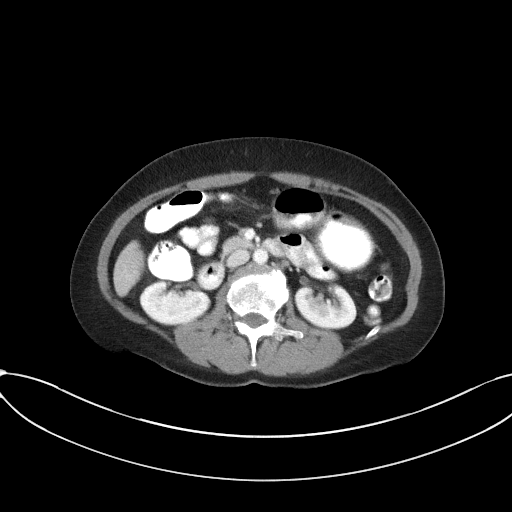
[im 63/92  soft-tissue]
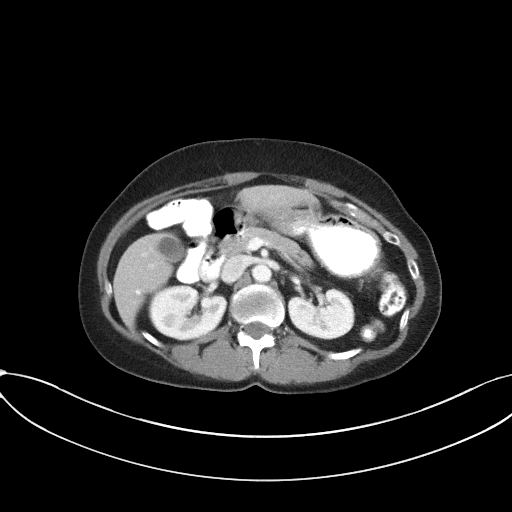
[im 63/92  bone]
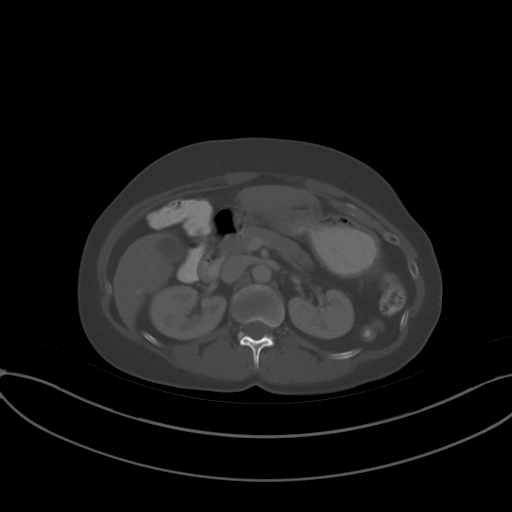
[im 72/92  soft-tissue]
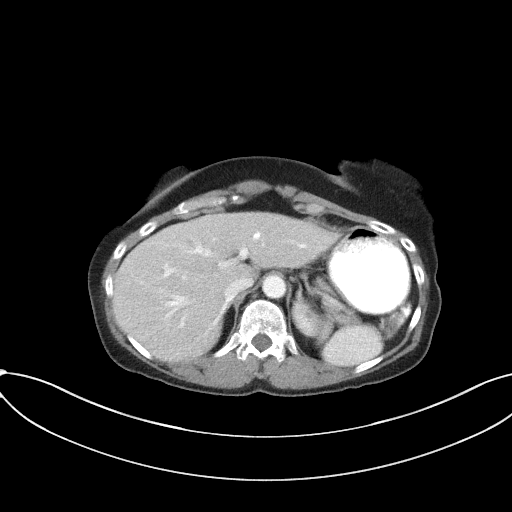
[im 77/92  soft-tissue]
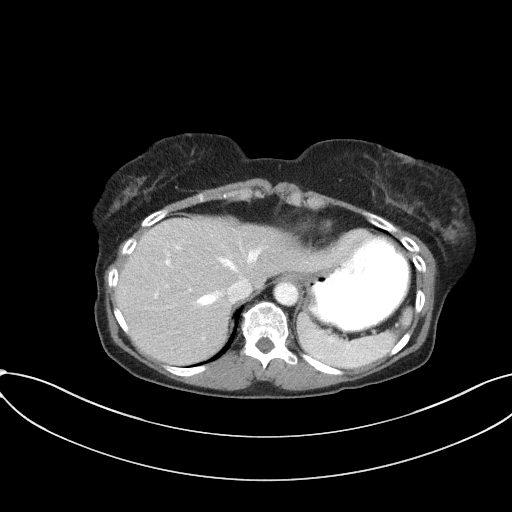
[im 87/92  soft-tissue]
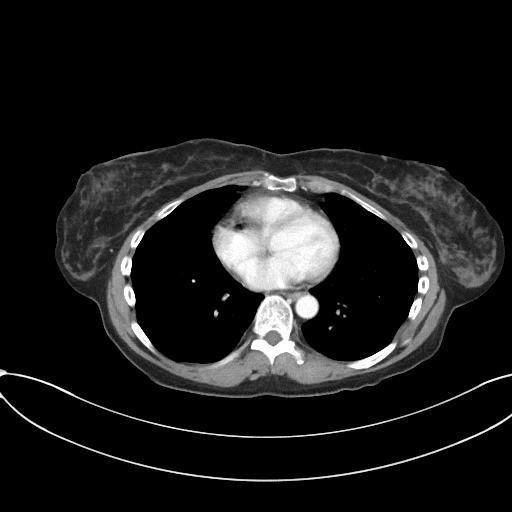

[Series 4: coronal st · coronal · 0.65mm/px · 3 of 72 slices shown]
[im 24/72  soft-tissue]
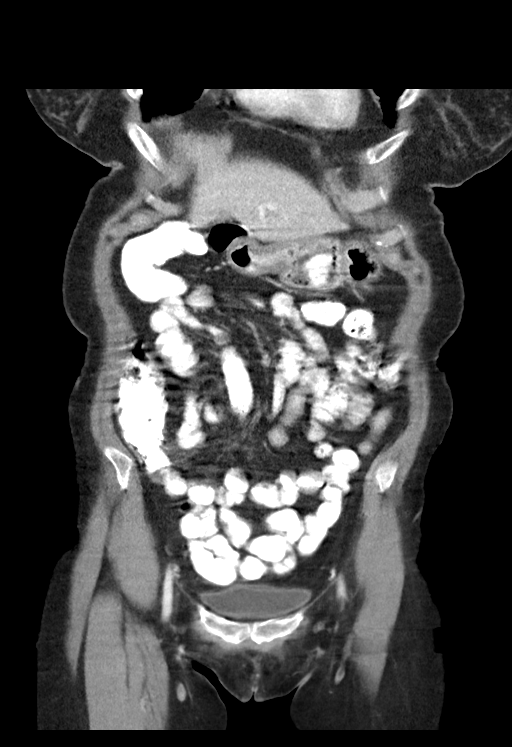
[im 32/72  soft-tissue]
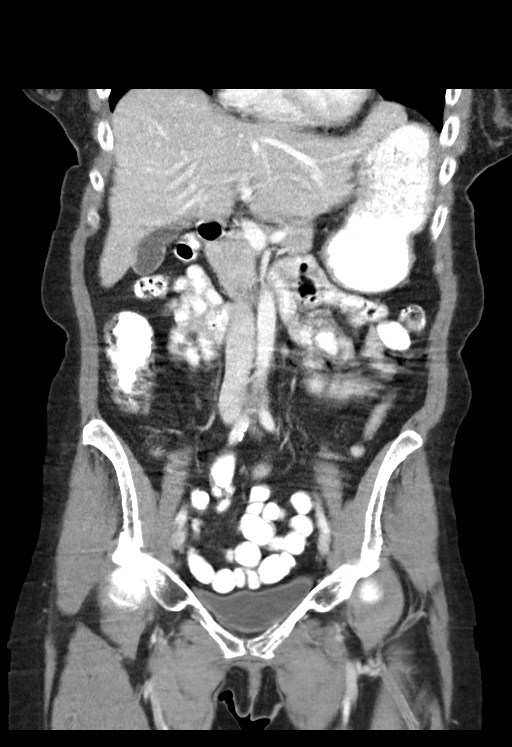
[im 40/72  soft-tissue]
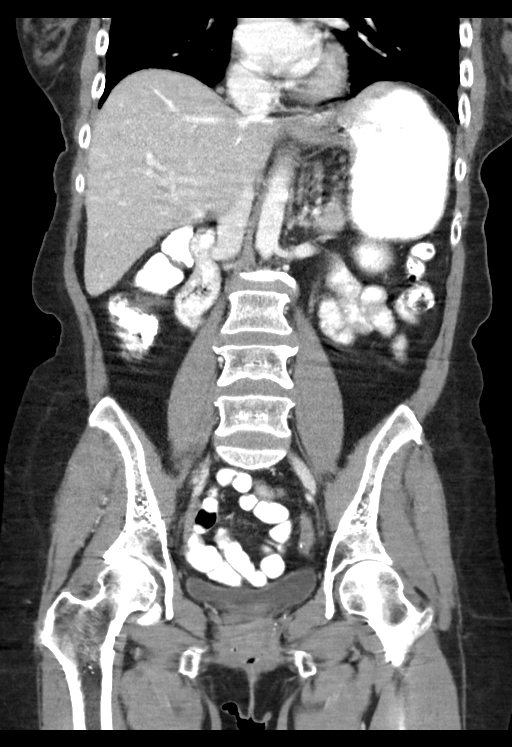

[15 of 46 positions shown; findings below may reference images not displayed]

FINDINGS: Lower chest: Unremarkable.

Hepatobiliary: No suspicious focal abnormality within the liver
parenchyma. There is no evidence for gallstones, gallbladder wall
thickening, or pericholecystic fluid. No intrahepatic or
extrahepatic biliary dilation.

Pancreas: No focal mass lesion. No dilatation of the main duct. No
intraparenchymal cyst. No peripancreatic edema.

Spleen: No splenomegaly. No focal mass lesion.

Adrenals/Urinary Tract: No adrenal nodule or mass. Kidneys
unremarkable. No evidence for hydroureter. The urinary bladder
appears normal for the degree of distention.

Stomach/Bowel: Stomach is unremarkable. No gastric wall thickening.
No evidence of outlet obstruction. Duodenum is normally positioned
as is the ligament of Treitz. No small bowel wall thickening. No
small bowel dilatation. The terminal ileum is normal. No gross
colonic mass. No colonic wall thickening.

Vascular/Lymphatic: No abdominal aortic aneurysm. There is no
gastrohepatic or hepatoduodenal ligament lymphadenopathy. No
intraperitoneal or retroperitoneal lymphadenopathy. No pelvic
sidewall lymphadenopathy.

Reproductive: The uterus is surgically absent. There is no adnexal
mass.

Other: No intraperitoneal free fluid.

Musculoskeletal: No worrisome lytic or sclerotic osseous
abnormality.
IMPRESSION: No acute findings in the abdomen or pelvis. Specifically, no
findings to explain the patient's history of lower abdominal pain
and rectal bleeding.

## 2021-01-05 ENCOUNTER — Ambulatory Visit (INDEPENDENT_AMBULATORY_CARE_PROVIDER_SITE_OTHER): Payer: Medicaid Other | Admitting: Sports Medicine

## 2021-01-05 ENCOUNTER — Other Ambulatory Visit: Payer: Self-pay

## 2021-01-05 ENCOUNTER — Encounter: Payer: Self-pay | Admitting: Sports Medicine

## 2021-01-05 DIAGNOSIS — M79609 Pain in unspecified limb: Secondary | ICD-10-CM

## 2021-01-05 DIAGNOSIS — M79676 Pain in unspecified toe(s): Secondary | ICD-10-CM

## 2021-01-05 DIAGNOSIS — B351 Tinea unguium: Secondary | ICD-10-CM | POA: Diagnosis not present

## 2021-01-05 NOTE — Progress Notes (Signed)
Patient ID: Susan Giles, female   DOB: 12-05-1965, 55 y.o.   MRN: [id number]   Subjective: Susan Giles is a 55 y.o. female patient seen today in office with complaint of painful thickened and elongated toenails; unable to trim.   Patient is from [facility] group home and is assisted by caregiver this visit.   Patient Active Problem List   Diagnosis Date Noted  . Alcohol dependence (HCC) 07/05/2014  . Uncomplicated alcohol dependence (HCC)     Current Outpatient Medications on File Prior to Visit  Medication Sig Dispense Refill  . aspirin EC 81 MG tablet Take 1 tablet (81 mg total) by mouth daily.    Marland Kitchen atorvastatin (LIPITOR) 40 MG tablet     . busPIRone (BUSPAR) 10 MG tablet TAKE ONE TABLET BY MOUTH TWO TIMES A DAY ( RECTANGLE TABLET WITH 10 OR OBLONG WITH 066)    . celecoxib (CELEBREX) 200 MG capsule TAKE 1 CAPSULE BY MOUTH 2 TIMES DAILY.(WHITE CAP WITH GREEN STRIPS)    . divalproex (DEPAKOTE) 250 MG DR tablet Take 2 tablets (500 mg total) by mouth 2 (two) times daily. 120 tablet 0  . docusate sodium (COLACE) 100 MG capsule Take 1 capsule (100 mg total) by mouth daily. 10 capsule 0  . Doxepin HCl 6 MG TABS Take 1 tablet by mouth at bedtime.    . fenofibrate (TRICOR) 145 MG tablet Take 1 tablet (145 mg total) by mouth daily.    Marland Kitchen FLUoxetine (PROZAC) 20 MG capsule Take 1 capsule (20 mg total) by mouth daily. 30 capsule 0  . fluticasone (CUTIVATE) 0.05 % cream Apply topically 2 (two) times daily. For 10 days 30 g 2  . hydrOXYzine (ATARAX/VISTARIL) 25 MG tablet Take 1 tablet (25 mg total) by mouth every 6 (six) hours as needed (anxiety). 21 tablet 0  . LINZESS 290 MCG CAPS capsule Take 290 mcg by mouth daily.    Marland Kitchen loratadine (CLARITIN) 10 MG tablet Take 1 tablet (10 mg total) by mouth daily.    Marland Kitchen lurasidone (LATUDA) 20 MG TABS tablet take 1 tablet by oral route  every evening with food (at least 350 calories)    . mirtazapine (REMERON) 15 MG tablet Take 15 mg by mouth at bedtime.     Marland Kitchen MYRBETRIQ 25 MG TB24 tablet TAKE ONE TABLET BY MOUTH ONCE DAILY. (OVAL BROWN TABLET WITH 325)    . predniSONE (DELTASONE) 5 MG tablet Take by mouth daily.    . rosuvastatin (CRESTOR) 5 MG tablet Take 1 tablet (5 mg total) by mouth daily.    Marland Kitchen terbinafine (LAMISIL AT) 1 % cream Apply 1 application topically 2 (two) times daily. To the bottoms of both feet 30 g 0  . traZODone (DESYREL) 50 MG tablet Take 1 tablet (50 mg total) by mouth at bedtime as needed for sleep. 14 tablet 0  . valACYclovir (VALTREX) 500 MG tablet Take 1 tablet (500 mg total) by mouth daily.    . Vitamin D, Ergocalciferol, (DRISDOL) 1.25 MG (50000 UNIT) CAPS capsule Take 50,000 Units by mouth once a week.     No current facility-administered medications on file prior to visit.    Allergies  Allergen Reactions  . Penicillin G   . Penicillins Other (See Comments)    Unknown     Objective: Physical Exam  General: Well developed, nourished, no acute distress, awake, alert and oriented  Vascular: Dorsalis pedis artery 2/4 bilateral, Posterior tibial artery 2/4 bilateral, skin temperature warm to warm proximal  to distal bilateral lower extremities, no varicosities, pedal hair present bilateral.  Neurological: Gross sensation present via light touch bilateral.   Dermatological: Skin is warm, dry, and supple bilateral, Nails 1-10 are tender, long, thick, and discolored with mild subungal debris, no acute ingrowing, no open lesions present bilateral, no callus/corns/hyperkeratotic tissue present bilateral. No signs of infection bilateral.  Musculoskeletal: Hammertoe deformities noted bilateral. Muscular strength within normal limits without pain on range of motion. No pain with calf compression bilateral.  Assessment and Plan:  Problem List Items Addressed This Visit   None   Visit Diagnoses    Pain due to onychomycosis of nail    -  Primary      -Examined patient.  -Re-Discussed treatment options for painful  mycotic nails  -Mechanically debrided and reduced mycotic nails with sterile nail nipper without incident  -PATIENT DOES NOT LIKE DREMEL/NAIL SMOOTH/NAIL SOAKING -Patient to return in 3 months/as needed for follow up evaluation or sooner if symptoms worsen.  [name], DPM

## 2021-04-07 ENCOUNTER — Ambulatory Visit: Payer: Medicaid Other | Admitting: Sports Medicine

## 2021-04-20 ENCOUNTER — Ambulatory Visit (INDEPENDENT_AMBULATORY_CARE_PROVIDER_SITE_OTHER): Payer: Medicaid Other | Admitting: Sports Medicine

## 2021-04-20 ENCOUNTER — Other Ambulatory Visit: Payer: Self-pay

## 2021-04-20 ENCOUNTER — Encounter: Payer: Self-pay | Admitting: Sports Medicine

## 2021-04-20 DIAGNOSIS — B351 Tinea unguium: Secondary | ICD-10-CM

## 2021-04-20 DIAGNOSIS — M79672 Pain in left foot: Secondary | ICD-10-CM | POA: Diagnosis not present

## 2021-04-20 DIAGNOSIS — B359 Dermatophytosis, unspecified: Secondary | ICD-10-CM

## 2021-04-20 DIAGNOSIS — M79609 Pain in unspecified limb: Secondary | ICD-10-CM | POA: Diagnosis not present

## 2021-04-20 DIAGNOSIS — M79671 Pain in right foot: Secondary | ICD-10-CM | POA: Diagnosis not present

## 2021-04-20 NOTE — Progress Notes (Signed)
Patient ID: Susan Giles, female   DOB: 1966-06-28, 55 y.o.   MRN: [id number]   Subjective: Susan Giles is a 55 y.o. female patient seen today in office with complaint of painful thickened and elongated toenails; unable to trim.   [facility] and is assisted by caregiver this visit.   Patient Active Problem List   Diagnosis Date Noted   Alcohol dependence (HCC) 07/05/2014   Uncomplicated alcohol dependence (HCC)     Current Outpatient Medications on File Prior to Visit  Medication Sig Dispense Refill   aspirin EC 81 MG tablet Take 1 tablet (81 mg total) by mouth daily.     atorvastatin (LIPITOR) 40 MG tablet      busPIRone (BUSPAR) 10 MG tablet TAKE ONE TABLET BY MOUTH TWO TIMES A DAY ( RECTANGLE TABLET WITH 10 OR OBLONG WITH 066)     celecoxib (CELEBREX) 200 MG capsule TAKE 1 CAPSULE BY MOUTH 2 TIMES DAILY.(WHITE CAP WITH GREEN STRIPS)     divalproex (DEPAKOTE) 250 MG DR tablet Take 2 tablets (500 mg total) by mouth 2 (two) times daily. 120 tablet 0   docusate sodium (COLACE) 100 MG capsule Take 1 capsule (100 mg total) by mouth daily. 10 capsule 0   Doxepin HCl 6 MG TABS Take 1 tablet by mouth at bedtime.     fenofibrate (TRICOR) 145 MG tablet Take 1 tablet (145 mg total) by mouth daily.     FLUoxetine (PROZAC) 20 MG capsule Take 1 capsule (20 mg total) by mouth daily. 30 capsule 0   fluticasone (CUTIVATE) 0.05 % cream Apply topically 2 (two) times daily. For 10 days 30 g 2   hydrOXYzine (ATARAX/VISTARIL) 25 MG tablet Take 1 tablet (25 mg total) by mouth every 6 (six) hours as needed (anxiety). 21 tablet 0   LINZESS 290 MCG CAPS capsule Take 290 mcg by mouth daily.     loratadine (CLARITIN) 10 MG tablet Take 1 tablet (10 mg total) by mouth daily.     lurasidone (LATUDA) 20 MG TABS tablet take 1 tablet by oral route  every evening with food (at least 350 calories)     mirtazapine (REMERON) 15 MG tablet Take 15 mg by mouth at bedtime.     MYRBETRIQ 25 MG  TB24 tablet TAKE ONE TABLET BY MOUTH ONCE DAILY. (OVAL BROWN TABLET WITH 325)     predniSONE (DELTASONE) 5 MG tablet Take by mouth daily.     rosuvastatin (CRESTOR) 5 MG tablet Take 1 tablet (5 mg total) by mouth daily.     terbinafine (LAMISIL AT) 1 % cream Apply 1 application topically 2 (two) times daily. To the bottoms of both feet 30 g 0   traZODone (DESYREL) 50 MG tablet Take 1 tablet (50 mg total) by mouth at bedtime as needed for sleep. 14 tablet 0   valACYclovir (VALTREX) 500 MG tablet Take 1 tablet (500 mg total) by mouth daily.     Vitamin D, Ergocalciferol, (DRISDOL) 1.25 MG (50000 UNIT) CAPS capsule Take 50,000 Units by mouth once a week.     No current facility-administered medications on file prior to visit.    Allergies  Allergen Reactions   Penicillin G    Penicillins Other (See Comments)    Unknown     Objective: Physical Exam  General: Well developed, nourished, no acute distress, awake, alert and oriented  Vascular: Dorsalis pedis artery 2/4 bilateral, Posterior tibial artery 2/4 bilateral, skin temperature warm to warm proximal  to distal bilateral lower extremities, no varicosities, pedal hair present bilateral.  Neurological: Gross sensation present via light touch bilateral.   Dermatological: Skin is warm, dry, and supple bilateral, Nails 1-10 are tender, long, thick, and discolored with mild subungal debris, no acute ingrowing, no open lesions present bilateral, no callus/corns/hyperkeratotic tissue present bilateral. No signs of infection bilateral.  Musculoskeletal: Hammertoe deformities noted bilateral. Muscular strength within normal limits without pain on range of motion. No pain with calf compression bilateral.  Assessment and Plan:  Problem List Items Addressed This Visit   None Visit Diagnoses     Pain due to onychomycosis of nail    -  Primary   Tinea       Foot pain, bilateral           -Examined patient.  -Re-Discussed treatment options  for painful mycotic nails  -Mechanically debrided and reduced mycotic nails with sterile nail nipper without incident  -PATIENT DOES NOT LIKE DREMEL/NAIL SMOOTH/NAIL SOAKING -Patient to return in 3 months/as needed for follow up evaluation or sooner if symptoms worsen.  Susan Giles, DPM

## 2021-07-21 ENCOUNTER — Encounter: Payer: Self-pay | Admitting: Sports Medicine

## 2021-07-21 ENCOUNTER — Ambulatory Visit (INDEPENDENT_AMBULATORY_CARE_PROVIDER_SITE_OTHER): Payer: Medicaid Other | Admitting: Sports Medicine

## 2021-07-21 DIAGNOSIS — M79672 Pain in left foot: Secondary | ICD-10-CM

## 2021-07-21 DIAGNOSIS — M79676 Pain in unspecified toe(s): Secondary | ICD-10-CM | POA: Diagnosis not present

## 2021-07-21 DIAGNOSIS — M79609 Pain in unspecified limb: Secondary | ICD-10-CM

## 2021-07-21 DIAGNOSIS — B351 Tinea unguium: Secondary | ICD-10-CM

## 2021-07-21 DIAGNOSIS — M79671 Pain in right foot: Secondary | ICD-10-CM

## 2021-07-21 NOTE — Progress Notes (Signed)
Patient ID: Susan Giles, female   DOB: [birth year], 55 y.o.   MRN: [id number]   Subjective: Susan Giles is a 55 y.o. female patient seen today in office with complaint of painful thickened and elongated toenails; unable to trim.   Patient is from [facility] group home and is assisted by caregiver this visit.   Denies any other complaints or concerns at this time.  Patient Active Problem List   Diagnosis Date Noted   Alcohol dependence (HCC) 07/05/2014   Uncomplicated alcohol dependence (HCC)     Current Outpatient Medications on File Prior to Visit  Medication Sig Dispense Refill   aspirin EC 81 MG tablet Take 1 tablet (81 mg total) by mouth daily.     atorvastatin (LIPITOR) 40 MG tablet      busPIRone (BUSPAR) 10 MG tablet TAKE ONE TABLET BY MOUTH TWO TIMES A DAY ( RECTANGLE TABLET WITH 10 OR OBLONG WITH 066)     celecoxib (CELEBREX) 200 MG capsule TAKE 1 CAPSULE BY MOUTH 2 TIMES DAILY.(WHITE CAP WITH GREEN STRIPS)     divalproex (DEPAKOTE) 250 MG DR tablet Take 2 tablets (500 mg total) by mouth 2 (two) times daily. 120 tablet 0   docusate sodium (COLACE) 100 MG capsule Take 1 capsule (100 mg total) by mouth daily. 10 capsule 0   Doxepin HCl 6 MG TABS Take 1 tablet by mouth at bedtime.     fenofibrate (TRICOR) 145 MG tablet Take 1 tablet (145 mg total) by mouth daily.     FLUoxetine (PROZAC) 20 MG capsule Take 1 capsule (20 mg total) by mouth daily. 30 capsule 0   fluticasone (CUTIVATE) 0.05 % cream Apply topically 2 (two) times daily. For 10 days 30 g 2   hydrOXYzine (ATARAX/VISTARIL) 25 MG tablet Take 1 tablet (25 mg total) by mouth every 6 (six) hours as needed (anxiety). 21 tablet 0   LINZESS 290 MCG CAPS capsule Take 290 mcg by mouth daily.     loratadine (CLARITIN) 10 MG tablet Take 1 tablet (10 mg total) by mouth daily.     lurasidone (LATUDA) 20 MG TABS tablet take 1 tablet by oral route  every evening with food (at least 350 calories)     mirtazapine (REMERON) 15 MG  tablet Take 15 mg by mouth at bedtime.     MYRBETRIQ 25 MG TB24 tablet TAKE ONE TABLET BY MOUTH ONCE DAILY. (OVAL BROWN TABLET WITH 325)     predniSONE (DELTASONE) 5 MG tablet Take by mouth daily.     rosuvastatin (CRESTOR) 5 MG tablet Take 1 tablet (5 mg total) by mouth daily.     terbinafine (LAMISIL AT) 1 % cream Apply 1 application topically 2 (two) times daily. To the bottoms of both feet 30 g 0   traZODone (DESYREL) 50 MG tablet Take 1 tablet (50 mg total) by mouth at bedtime as needed for sleep. 14 tablet 0   valACYclovir (VALTREX) 500 MG tablet Take 1 tablet (500 mg total) by mouth daily.     Vitamin D, Ergocalciferol, (DRISDOL) 1.25 MG (50000 UNIT) CAPS capsule Take 50,000 Units by mouth once a week.     No current facility-administered medications on file prior to visit.    Allergies  Allergen Reactions   Penicillin G    Penicillins Other (See Comments)    Unknown     Objective: Physical Exam  General: Well developed, nourished, no acute distress, awake, alert and oriented  Vascular: Dorsalis pedis artery 2/4 bilateral, Posterior  tibial artery 2/4 bilateral, skin temperature warm to warm proximal to distal bilateral lower extremities, no varicosities, pedal hair present bilateral.  Neurological: Gross sensation present via light touch bilateral.   Dermatological: Skin is warm, dry, and supple bilateral, Nails 1-10 are tender, long, thick, and discolored with mild subungal debris, no acute ingrowing, no open lesions present bilateral, no callus/corns/hyperkeratotic tissue present bilateral. No signs of infection bilateral.  Musculoskeletal: Hammertoe deformities noted bilateral. Muscular strength within normal limits without pain on range of motion. No pain with calf compression bilateral.  Assessment and Plan:  Problem List Items Addressed This Visit   None Visit Diagnoses     Pain due to onychomycosis of nail    -  Primary   Foot pain, bilateral            -Examined patient.  -Re-Discussed treatment options for painful mycotic nails  -Mechanically debrided and reduced mycotic nails with sterile nail nipper without incident  -PATIENT DOES NOT LIKE DREMEL/NAIL SMOOTH/NAIL SOAKING as previously noted -Patient to return in 3 months/as needed for follow up evaluation or sooner if symptoms worsen.  Susan Giles, DPM

## 2021-10-20 ENCOUNTER — Other Ambulatory Visit: Payer: Self-pay

## 2021-10-20 ENCOUNTER — Ambulatory Visit: Payer: Medicaid Other | Admitting: Sports Medicine

## 2021-10-20 ENCOUNTER — Encounter: Payer: Self-pay | Admitting: Sports Medicine

## 2021-10-20 DIAGNOSIS — M79671 Pain in right foot: Secondary | ICD-10-CM

## 2021-10-20 DIAGNOSIS — M79609 Pain in unspecified limb: Secondary | ICD-10-CM

## 2021-10-20 DIAGNOSIS — B359 Dermatophytosis, unspecified: Secondary | ICD-10-CM

## 2021-10-20 DIAGNOSIS — M79676 Pain in unspecified toe(s): Secondary | ICD-10-CM

## 2021-10-20 DIAGNOSIS — S90811A Abrasion, right foot, initial encounter: Secondary | ICD-10-CM

## 2021-10-20 DIAGNOSIS — M79672 Pain in left foot: Secondary | ICD-10-CM

## 2021-10-20 DIAGNOSIS — B351 Tinea unguium: Secondary | ICD-10-CM | POA: Diagnosis not present

## 2021-10-20 NOTE — Progress Notes (Signed)
Patient ID: Susan Giles, female   DOB: [birth year], 56 y.o.   MRN: [id number]  ? ?Subjective: ?Susan Giles is a 56 y.o. female patient seen today in office with complaint of painful thickened and elongated toenails; unable to trim.  Patient's caregiver also reports that she stepped on a nail several weeks ago but patient will not let them take her to the doctor. ? ?Patient is from [facility] group home and is assisted by caregiver this visit.  ? ?Denies any other complaints or concerns at this time. ? ?Patient Active Problem List  ? Diagnosis Date Noted  ? Alcohol dependence (HCC) 07/05/2014  ? Uncomplicated alcohol dependence (HCC)   ? ? ?Current Outpatient Medications on File Prior to Visit  ?Medication Sig Dispense Refill  ? busPIRone (BUSPAR) 10 MG tablet Take by mouth.    ? hydrOXYzine (ATARAX) 25 MG tablet Take by mouth.    ? mirtazapine (REMERON) 15 MG tablet Take 1 tablet by mouth at bedtime.    ? aspirin EC 81 MG tablet Take 1 tablet (81 mg total) by mouth daily.    ? atorvastatin (LIPITOR) 40 MG tablet     ? busPIRone (BUSPAR) 10 MG tablet TAKE ONE TABLET BY MOUTH TWO TIMES A DAY ( RECTANGLE TABLET WITH 10 OR OBLONG WITH 066)    ? celecoxib (CELEBREX) 200 MG capsule TAKE 1 CAPSULE BY MOUTH 2 TIMES DAILY.(WHITE CAP WITH GREEN STRIPS)    ? divalproex (DEPAKOTE) 250 MG DR tablet Take 2 tablets (500 mg total) by mouth 2 (two) times daily. 120 tablet 0  ? docusate sodium (COLACE) 100 MG capsule Take 1 capsule (100 mg total) by mouth daily. 10 capsule 0  ? Doxepin HCl 6 MG TABS Take 1 tablet by mouth at bedtime.    ? fenofibrate (TRICOR) 145 MG tablet Take 1 tablet (145 mg total) by mouth daily.    ? FLUoxetine (PROZAC) 20 MG capsule Take 1 capsule (20 mg total) by mouth daily. 30 capsule 0  ? fluticasone (CUTIVATE) 0.05 % cream Apply topically 2 (two) times daily. For 10 days 30 g 2  ? hydrOXYzine (ATARAX/VISTARIL) 25 MG tablet Take 1 tablet (25 mg total) by mouth every 6 (six) hours as needed (anxiety).  21 tablet 0  ? LINZESS 290 MCG CAPS capsule Take 290 mcg by mouth daily.    ? loratadine (CLARITIN) 10 MG tablet Take 1 tablet (10 mg total) by mouth daily.    ? lurasidone (LATUDA) 20 MG TABS tablet take 1 tablet by oral route  every evening with food (at least 350 calories)    ? mirtazapine (REMERON) 15 MG tablet Take 15 mg by mouth at bedtime.    ? MYRBETRIQ 25 MG TB24 tablet TAKE ONE TABLET BY MOUTH ONCE DAILY. (OVAL BROWN TABLET WITH 325)    ? predniSONE (DELTASONE) 5 MG tablet Take by mouth daily.    ? rosuvastatin (CRESTOR) 5 MG tablet Take 1 tablet (5 mg total) by mouth daily.    ? terbinafine (LAMISIL AT) 1 % cream Apply 1 application topically 2 (two) times daily. To the bottoms of both feet 30 g 0  ? traZODone (DESYREL) 50 MG tablet Take 1 tablet (50 mg total) by mouth at bedtime as needed for sleep. 14 tablet 0  ? valACYclovir (VALTREX) 500 MG tablet Take 1 tablet (500 mg total) by mouth daily.    ? Vitamin D, Ergocalciferol, (DRISDOL) 1.25 MG (50000 UNIT) CAPS capsule Take 50,000 Units by mouth once  a week.    ? ?No current facility-administered medications on file prior to visit.  ? ? ?Allergies  ?Allergen Reactions  ? Penicillin G   ? Penicillins Other (See Comments)  ?  Unknown   ? ? ?Objective: ?Physical Exam ? ?General: Well developed, nourished, no acute distress, awake, alert and oriented ? ?Vascular: Dorsalis pedis artery 2/4 bilateral, Posterior tibial artery 2/4 bilateral, skin temperature warm to warm proximal to distal bilateral lower extremities, no varicosities, pedal hair present bilateral. ? ?Neurological: Gross sensation present via light touch bilateral.  ? ?Dermatological: Skin is warm, dry, and supple bilateral, Nails 1-10 are tender, long, thick, and discolored with mild subungal debris, no acute ingrowing, no open lesions present bilateral, no callus/corns/hyperkeratotic tissue present bilateral.  There is a abrasion noted to the right heel with no surrounding erythema edema no  active drainage patient is refusing care or x-rays at this time for this area.  No signs of infection bilateral. ? ?Musculoskeletal: Hammertoe deformities noted bilateral. Muscular strength within normal limits without pain on range of motion. No pain with calf compression bilateral. ? ?Assessment and Plan:  ?Problem List Items Addressed This Visit   ?None ?Visit Diagnoses   ? ? Pain due to onychomycosis of nail    -  Primary  ? Tinea      ? Foot pain, bilateral      ? Abrasion of right heel, initial encounter      ? ?  ? ? ?-Examined patient.  ?-Re-Discussed treatment options for painful mycotic nails  ?-Mechanically debrided and reduced mycotic nails with sterile nail nipper without incident  ?-PATIENT DOES NOT LIKE DREMEL/NAIL SMOOTH/NAIL SOAKING as previously noted ?-Patient refused care for the abrasion on the right heel however she did allow me to apply antibiotic cream and Band-Aid and advised patient to do the same but if worsens must return to office sooner or go to hospital ?-Patient to return in 3 months/as needed for follow up evaluation or sooner if symptoms worsen. ? ?[name], DPM ? ?

## 2021-11-04 ENCOUNTER — Telehealth: Payer: Self-pay | Admitting: Urology

## 2021-11-04 ENCOUNTER — Other Ambulatory Visit: Payer: Self-pay | Admitting: Sports Medicine

## 2021-11-04 MED ORDER — CICLOPIROX 8 % EX SOLN: Freq: Every day | CUTANEOUS | 0 refills | Status: AC

## 2021-11-04 NOTE — Telephone Encounter (Signed)
Pt is requesting something to put on her toe nail fungus. Please Advise.  ?

## 2021-11-04 NOTE — Progress Notes (Signed)
Rx Penlac solution to apply to nails for nail fungus. This was sent to pharmacy on file. ?-Dr.S ?

## 2022-01-14 ENCOUNTER — Ambulatory Visit: Payer: Medicaid Other | Admitting: Sports Medicine

## 2022-01-19 ENCOUNTER — Ambulatory Visit: Payer: Medicaid Other | Admitting: Sports Medicine

## 2024-02-22 ENCOUNTER — Ambulatory Visit: Payer: MEDICAID | Admitting: Podiatry

## 2024-03-06 ENCOUNTER — Encounter: Payer: MEDICAID | Admitting: Podiatry

## 2024-03-06 NOTE — Progress Notes (Signed)
Patient did not show for scheduled appointment today.

## 2024-08-02 ENCOUNTER — Ambulatory Visit: Payer: MEDICAID | Admitting: Podiatry

## 2024-08-05 ENCOUNTER — Ambulatory Visit: Payer: MEDICAID | Admitting: Podiatry
